# Patient Record
Sex: Male | Born: 1945
Health system: Southern US, Community
[De-identification: ages and names within clinical notes are randomized; demographics above are authoritative.]

## PROBLEM LIST (undated history)

## (undated) DIAGNOSIS — M1990 Unspecified osteoarthritis, unspecified site: Secondary | ICD-10-CM

## (undated) DIAGNOSIS — I4891 Unspecified atrial fibrillation: Secondary | ICD-10-CM

## (undated) DIAGNOSIS — I499 Cardiac arrhythmia, unspecified: Secondary | ICD-10-CM

## (undated) DIAGNOSIS — I10 Essential (primary) hypertension: Secondary | ICD-10-CM

## (undated) DIAGNOSIS — F419 Anxiety disorder, unspecified: Secondary | ICD-10-CM

## (undated) DIAGNOSIS — N400 Enlarged prostate without lower urinary tract symptoms: Secondary | ICD-10-CM

## (undated) HISTORY — PX: HERNIA REPAIR: SHX51

## (undated) HISTORY — PX: CATARACT EXTRACTION: SUR2

## (undated) HISTORY — PX: TONSILLECTOMY: SUR1361

---

## 2002-12-28 ENCOUNTER — Encounter: Payer: Self-pay | Admitting: Internal Medicine

## 2002-12-28 ENCOUNTER — Encounter: Admission: RE | Admit: 2002-12-28 | Discharge: 2002-12-28 | Payer: Self-pay | Admitting: Internal Medicine

## 2003-01-01 ENCOUNTER — Encounter: Admission: RE | Admit: 2003-01-01 | Discharge: 2003-01-01 | Payer: Self-pay | Admitting: Internal Medicine

## 2003-01-01 ENCOUNTER — Encounter: Payer: Self-pay | Admitting: Internal Medicine

## 2004-10-16 ENCOUNTER — Ambulatory Visit (HOSPITAL_COMMUNITY): Admission: RE | Admit: 2004-10-16 | Discharge: 2004-10-16 | Payer: Self-pay | Admitting: Gastroenterology

## 2007-02-23 ENCOUNTER — Encounter: Admission: RE | Admit: 2007-02-23 | Discharge: 2007-02-23 | Payer: Self-pay | Admitting: Gastroenterology

## 2008-08-26 IMAGING — NM NM HEPATO W/GB/PHARM/[PERSON_NAME]
5 series · 10 of 10 positions shown · non-contrast
Comparison: none

CLINICAL DATA: Abdominal pain particularly right upper quadrant. 
 HEPATOBILIARY SCAN WITH GALLBLADDER EJECTION FRACTION:
TECHNIQUE: Sequential abdominal images were obtained following intravenous injection of radiopharmaceutical.  Sequential images were continued following oral ingestion of 8 oz. Half & Half, and the gallbladder ejection fraction was calculated.
 Radiopharmaceutical:  5 mCi Kc-PPm Choletec

[gb hepatobiliary · 1 of 1 slices shown (1 of 5)]
[im 1/1]
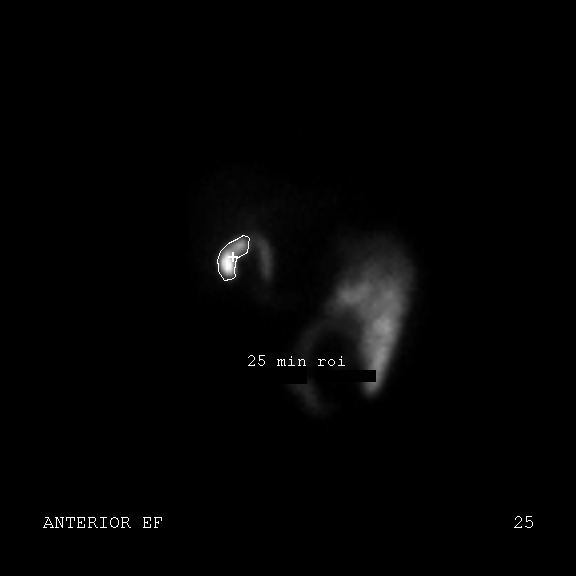

[gb hepatobiliary · 1 of 1 slices shown (2 of 5)]
[im 1/1]
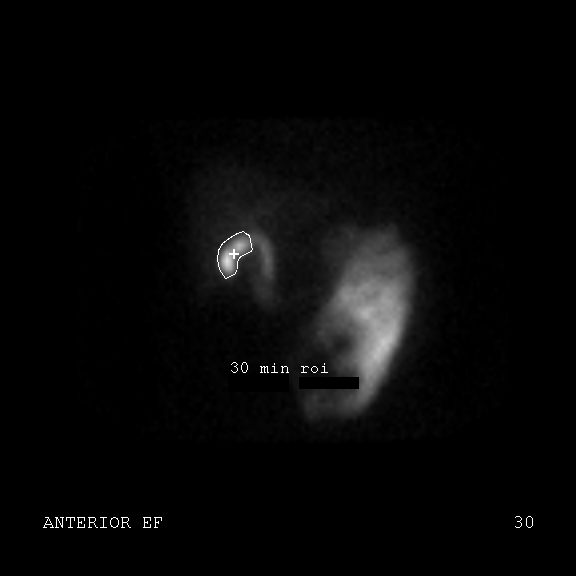

[gb hepatobiliary · 4.66mm/px · 6 of 12 frames shown (3 of 5)]
[frame 2/12]
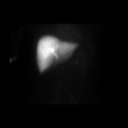
[frame 4/12]
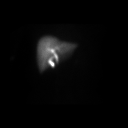
[frame 6/12]
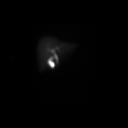
[frame 8/12]
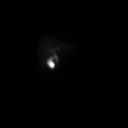
[frame 10/12]
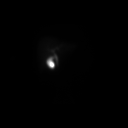
[frame 12/12]
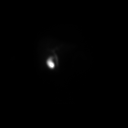

[gb hepatobiliary · 1 of 1 slices shown (4 of 5)]
[im 1/1]
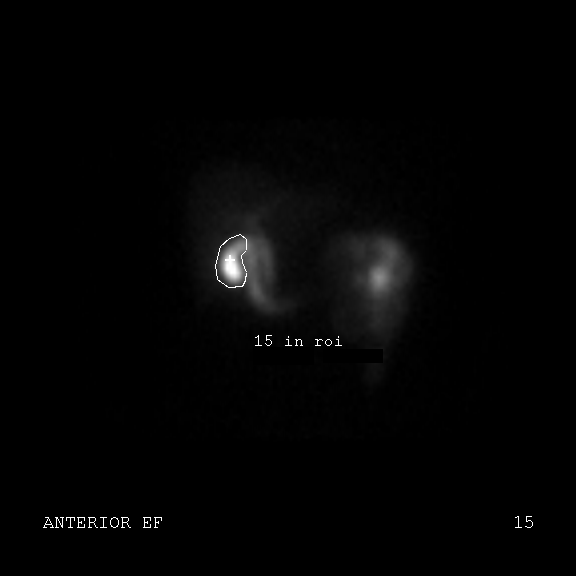

[gb hepatobiliary · 1 of 1 slices shown (5 of 5)]
[im 1/1]
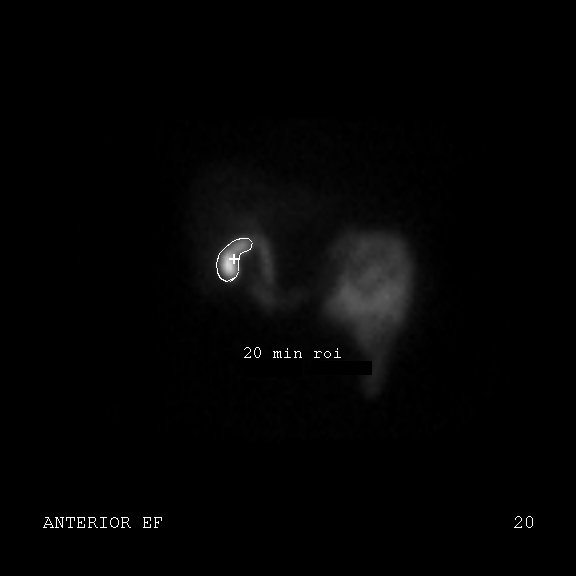

[10 of 10 positions shown; findings below may reference images not displayed]

FINDINGS: The radionuclide appears normally throughout the liver, and there was rapid excretion into the intrahepatic ductal system, common bile duct, and gallbladder with small bowel also visualized.  This represents a normal nuclear medicine hepatobiliary scan.  
 The patient was then given 8 ounces of Half & Half cream orally and imaging over the gallbladder was performed.  At 30 minutes, the gallbladder ejection fraction was measured at 87% which is within normal limits.
IMPRESSION: 1.  Normal nuclear medicine hepatobiliary scan. 
 2.  Normal gallbladder ejection fraction of 87% at 30 minutes.

## 2011-02-13 NOTE — Op Note (Signed)
NAMEJAMESEN, STAHNKE                 ACCOUNT NO.:  [id number]   MEDICAL RECORD NO.:  [id number]          PATIENT TYPE:  AMB   LOCATION:  ENDO                         FACILITY:  [facility]   PHYSICIAN:  Danise Edge, M.D.   DATE OF BIRTH:  05-18-1946   DATE OF PROCEDURE:  10/16/2004  DATE OF DISCHARGE:                                 OPERATIVE REPORT   PROCEDURE:  Screening colonoscopy.   PROCEDURE INDICATION:  Mr. Juan Butler is a 65 year old male born January 11, 1946.  Mr. Juan Butler is scheduled to undergo a screening colonoscopy with  polypectomy to prevent colon cancer.   ENDOSCOPIST:  Danise Edge, M.D.   PREMEDICATION:  Versed 12 mg, Demerol 120 mg.   PROCEDURE:  After obtaining informed consent, Mr. Juan Butler was placed in the  left lateral decubitus position.  I administered intravenous Demerol and  intravenous Versed to achieve conscious sedation for the procedure.  The  patient's blood pressure, oxygen saturation and cardiac rhythm were  monitored throughout the procedure and documented in the medical record.   Anal inspection and digital rectal exam were normal.  The prostate was non-  nodular.  The Olympus adjustable pediatric colonoscope was introduced into  the rectum and advanced to the cecum.  A normal-appearing ileocecal valve  was intubated and the distal ileum inspected.  Colonic preparation for the  exam today was excellent.   Rectum normal.   Sigmoid colon and descending colon normal.   Splenic flexure normal.   Transverse colon normal.   Hepatic flexure normal.   Ascending colon normal.   Cecum and ileocecal valve normal.   Distal ileum normal.   ASSESSMENT:  Normal proctocolonoscopy to the cecum.      MJ/MEDQ  D:  10/16/2004  T:  10/16/2004  Job:  [id number]   cc:   Georgann Housekeeper, MD  [address]  [place]  [place] [address]  Fax: [phone]

## 2015-06-20 ENCOUNTER — Ambulatory Visit (INDEPENDENT_AMBULATORY_CARE_PROVIDER_SITE_OTHER): Payer: Medicare Other

## 2015-06-20 ENCOUNTER — Ambulatory Visit (INDEPENDENT_AMBULATORY_CARE_PROVIDER_SITE_OTHER): Payer: Medicare Other | Admitting: Podiatry

## 2015-06-20 VITALS — BP 132/73 | HR 63 | Resp 16

## 2015-06-20 DIAGNOSIS — M722 Plantar fascial fibromatosis: Secondary | ICD-10-CM

## 2015-06-20 DIAGNOSIS — M79673 Pain in unspecified foot: Secondary | ICD-10-CM

## 2015-06-20 MED ORDER — TRIAMCINOLONE ACETONIDE 10 MG/ML IJ SUSP: 10.0000 mg | Freq: Once | INTRAMUSCULAR | Status: DC

## 2015-06-20 NOTE — Progress Notes (Signed)
   Subjective:    Patient ID: Juan Butler, male    DOB: 1946-08-22, 69 y.o.   MRN: [id number]  HPI Pt presents with pain in right heel on the bottom lasting 2 weeks, has tried otc inserts which were helpful but still having pain   Review of Systems  All other systems reviewed and are negative.      Objective:   Physical Exam        Assessment & Plan:

## 2015-06-20 NOTE — Patient Instructions (Signed)

## 2015-06-21 NOTE — Progress Notes (Signed)
Subjective:     Patient ID: Juan Butler, male   DOB: 1946/08/14, 69 y.o.   MRN: [id number]  HPI patient states my right heel has been bothering me a lot and it's been getting gradually worse recently. I've tried to reduce activity and change shoes   Review of Systems  All other systems reviewed and are negative.      Objective:   Physical Exam  Constitutional: He is oriented to person, place, and time.  Cardiovascular: Intact distal pulses.   Musculoskeletal: Normal range of motion.  Neurological: He is oriented to person, place, and time.  Skin: Skin is warm.  Nursing note and vitals reviewed.  neurovascular status found to be intact with muscle strength adequate range of motion within normal limits. Patient's noted to have good digital perfusion is well oriented 3 and has exquisite discomfort plantar aspect heel at the insertional point of the tendon into the calcaneus     Assessment:     Plantar fasciitis right with inflammation of the medial band    Plan:     H&P and x-rays reviewed and today I injected the plantar fascia 3 mg Kenalog 5 mg Xylocaine and applied fascial brace with instructions on usage. Reappoint to recheck

## 2015-07-04 ENCOUNTER — Ambulatory Visit (INDEPENDENT_AMBULATORY_CARE_PROVIDER_SITE_OTHER): Payer: Medicare Other | Admitting: Podiatry

## 2015-07-04 ENCOUNTER — Encounter: Payer: Self-pay | Admitting: Podiatry

## 2015-07-04 DIAGNOSIS — M722 Plantar fascial fibromatosis: Secondary | ICD-10-CM

## 2015-07-06 NOTE — Progress Notes (Signed)
Subjective:     Patient ID: Juan Butler, male   DOB: Jan 31, 1946, 69 y.o.   MRN: [id number]  HPI patient presents with pain in the right heel which is improving quite a bit with mild discomfort with prolonged ambulation   Review of Systems     Objective:   Physical Exam Neurovascular status found to be intact muscle strength was adequate with discomfort in the right plantar heel that is significantly improved from previous visit    Assessment:     Improved plantar fasciitis right with diminishment of fluid buildup    Plan:     Reviewed condition and advised on physical therapy anti-inflammatory's and supportive shoe gear usage. Patient will be seen back to recheck again as needed

## 2019-10-24 ENCOUNTER — Ambulatory Visit: Payer: Self-pay

## 2019-11-02 ENCOUNTER — Ambulatory Visit: Payer: Medicare Other | Attending: Internal Medicine

## 2019-11-02 DIAGNOSIS — Z23 Encounter for immunization: Secondary | ICD-10-CM

## 2019-11-02 NOTE — Progress Notes (Signed)
   Covid-19 Vaccination Clinic  Name:  Juan Butler    MRN: [id number] DOB: Jul 01, 1946  11/02/2019  Mr. Juan Butler was observed post Covid-19 immunization for 15 minutes without incidence. He was provided with Vaccine Information Sheet and instruction to access the V-Safe system.   Mr. Juan Butler was instructed to call 911 with any severe reactions post vaccine: Marland Kitchen Difficulty breathing  . Swelling of your face and throat  . A fast heartbeat  . A bad rash all over your body  . Dizziness and weakness    Immunizations Administered    Name Date Dose VIS Date Route   Pfizer COVID-19 Vaccine 11/02/2019  1:37 PM 0.3 mL 09/08/2019 Intramuscular   Manufacturer: [place]   Lot: [id number]   [place]: [id number]

## 2019-11-27 ENCOUNTER — Ambulatory Visit: Payer: Medicare Other | Attending: Internal Medicine

## 2019-11-27 DIAGNOSIS — Z23 Encounter for immunization: Secondary | ICD-10-CM | POA: Insufficient documentation

## 2019-11-27 NOTE — Progress Notes (Signed)
   Covid-19 Vaccination Clinic  Name:  Juan Butler    MRN: [id number] DOB: 11/24/1945  11/27/2019  [name] was observed post Covid-19 immunization for 15 minutes without incidence. He was provided with Vaccine Information Sheet and instruction to access the V-Safe system.   [name] was instructed to call 911 with any severe reactions post vaccine: Marland Kitchen Difficulty breathing  . Swelling of your face and throat  . A fast heartbeat  . A bad rash all over your body  . Dizziness and weakness    Immunizations Administered    Name Date Dose VIS Date Route   Pfizer COVID-19 Vaccine 11/27/2019  3:10 PM 0.3 mL 09/08/2019 Intramuscular   Manufacturer: [place]   Lot: [id number]   [place]: [id number]

## 2020-07-15 ENCOUNTER — Other Ambulatory Visit: Payer: Self-pay

## 2020-07-15 ENCOUNTER — Ambulatory Visit (INDEPENDENT_AMBULATORY_CARE_PROVIDER_SITE_OTHER): Payer: Medicare Other | Admitting: Otolaryngology

## 2020-07-15 ENCOUNTER — Encounter (INDEPENDENT_AMBULATORY_CARE_PROVIDER_SITE_OTHER): Payer: Self-pay | Admitting: Otolaryngology

## 2020-07-15 VITALS — Temp 97.7°F

## 2020-07-15 DIAGNOSIS — H6982 Other specified disorders of Eustachian tube, left ear: Secondary | ICD-10-CM

## 2020-07-15 DIAGNOSIS — H6123 Impacted cerumen, bilateral: Secondary | ICD-10-CM | POA: Diagnosis not present

## 2020-07-15 NOTE — Progress Notes (Signed)
HPI: Juan Butler is a 74 y.o. male who presents for evaluation of left ear complaints.  He was having some popping and sensation like he had fluid in the ear.  This was on the left side.  He has history of wax problems and is used Q-tips regularly and wondered if he had wax buildup in his ears.  He was seen by his medical physician PA who thought that he had fluid behind his ears and prescribed Zyrtec and also Flonase.  He tried Zyrtec without much benefit but has not tried the [facility].  He is not noticing any hearing problems.  No past medical history on file.  Social History   Socioeconomic History  . Marital status: Married    Spouse name: Not on file  . Number of children: Not on file  . Years of education: Not on file  . Highest education level: Not on file  Occupational History  . Not on file  Tobacco Use  . Smoking status: Never Smoker  . Smokeless tobacco: Never Used  Substance and Sexual Activity  . Alcohol use: Not on file  . Drug use: Not on file  . Sexual activity: Not on file  Other Topics Concern  . Not on file  Social History Narrative  . Not on file   Social Determinants of Health   Financial Resource Strain:   . Difficulty of Paying Living Expenses: Not on file  Food Insecurity:   . Worried About Charity fundraiser in the Last Year: Not on file  . Ran Out of Food in the Last Year: Not on file  Transportation Needs:   . Lack of Transportation (Medical): Not on file  . Lack of Transportation (Non-Medical): Not on file  Physical Activity:   . Days of Exercise per Week: Not on file  . Minutes of Exercise per Session: Not on file  Stress:   . Feeling of Stress : Not on file  Social Connections:   . Frequency of Communication with Friends and Family: Not on file  . Frequency of Social Gatherings with Friends and Family: Not on file  . Attends Religious Services: Not on file  . Active Member of Clubs or Organizations: Not on file  . Attends Theatre manager Meetings: Not on file  . Marital Status: Not on file   No family history on file. No Known Allergies Prior to Admission medications   Not on File     Positive ROS: Otherwise negative  All other systems have been reviewed and were otherwise negative with the exception of those mentioned in the HPI and as above.  Physical Exam: Constitutional: Alert, well-appearing, no acute distress Ears: External ears without lesions or tenderness.  He had minimal wax buildup in both ears that was cleaned with curette and suction's.  Left ear canal was irrigated with hydrogen peroxide and suction cleaned.  But there is only minimal wax buildup.  The TM itself was clear with good mobility on pneumatic otoscopy and no obvious serous otitis noted on clinical exam in the office today.  On tuning fork testing heard about the same in both ears with AC > BC bilaterally.  Weber was midline. Nasal: External nose without lesions. Septum mild deformity and mild rhinitis..  Middle meatus regions were clear with no signs of infection Oral: Lips and gums without lesions. Tongue and palate mucosa without lesions. Posterior oropharynx clear. Neck: No palpable adenopathy or masses Respiratory: Breathing comfortably  Skin: No facial/neck  lesions or rash noted.  Cerumen impaction removal  Date/Time: 07/15/2020 2:15 PM Performed by: Rozetta Nunnery, MD Authorized by: Rozetta Nunnery, MD   Consent:    Consent obtained:  Verbal   Consent given by:  Patient   Risks discussed:  Pain and bleeding Procedure details:    Location:  L ear and R ear   Procedure type: curette and suction   Post-procedure details:    Inspection:  TM intact and canal normal   Hearing quality:  Improved   Patient tolerance of procedure:  Tolerated well, no immediate complications Comments:     TMs are clear bilaterally.    Assessment: Minimal wax buildup.  Otherwise normal ear and TM  examination.  Plan: Suggested use of the Flonase 2 sprays each nostril at night if he is having symptoms with eustachian tube problems. Recommended not using Q-tips and use of hydroperoxide or Debrox drops if he has wax buildup. He will follow-up as needed.  [name], MD

## 2020-09-05 DIAGNOSIS — L089 Local infection of the skin and subcutaneous tissue, unspecified: Secondary | ICD-10-CM | POA: Insufficient documentation

## 2020-10-02 DIAGNOSIS — M5136 Other intervertebral disc degeneration, lumbar region: Secondary | ICD-10-CM | POA: Diagnosis not present

## 2020-10-02 DIAGNOSIS — M9905 Segmental and somatic dysfunction of pelvic region: Secondary | ICD-10-CM | POA: Diagnosis not present

## 2020-10-02 DIAGNOSIS — M9904 Segmental and somatic dysfunction of sacral region: Secondary | ICD-10-CM | POA: Diagnosis not present

## 2020-10-02 DIAGNOSIS — M9903 Segmental and somatic dysfunction of lumbar region: Secondary | ICD-10-CM | POA: Diagnosis not present

## 2020-10-09 DIAGNOSIS — H25812 Combined forms of age-related cataract, left eye: Secondary | ICD-10-CM | POA: Diagnosis not present

## 2020-10-09 DIAGNOSIS — H2512 Age-related nuclear cataract, left eye: Secondary | ICD-10-CM | POA: Diagnosis not present

## 2020-10-09 DIAGNOSIS — H52222 Regular astigmatism, left eye: Secondary | ICD-10-CM | POA: Diagnosis not present

## 2020-10-15 DIAGNOSIS — H25011 Cortical age-related cataract, right eye: Secondary | ICD-10-CM | POA: Diagnosis not present

## 2020-10-15 DIAGNOSIS — H2511 Age-related nuclear cataract, right eye: Secondary | ICD-10-CM | POA: Diagnosis not present

## 2020-10-16 DIAGNOSIS — L218 Other seborrheic dermatitis: Secondary | ICD-10-CM | POA: Diagnosis not present

## 2020-10-17 DIAGNOSIS — M5136 Other intervertebral disc degeneration, lumbar region: Secondary | ICD-10-CM | POA: Diagnosis not present

## 2020-10-17 DIAGNOSIS — M9904 Segmental and somatic dysfunction of sacral region: Secondary | ICD-10-CM | POA: Diagnosis not present

## 2020-10-17 DIAGNOSIS — M9905 Segmental and somatic dysfunction of pelvic region: Secondary | ICD-10-CM | POA: Diagnosis not present

## 2020-10-17 DIAGNOSIS — M9903 Segmental and somatic dysfunction of lumbar region: Secondary | ICD-10-CM | POA: Diagnosis not present

## 2020-10-23 DIAGNOSIS — M5136 Other intervertebral disc degeneration, lumbar region: Secondary | ICD-10-CM | POA: Diagnosis not present

## 2020-10-23 DIAGNOSIS — M9905 Segmental and somatic dysfunction of pelvic region: Secondary | ICD-10-CM | POA: Diagnosis not present

## 2020-10-23 DIAGNOSIS — M9904 Segmental and somatic dysfunction of sacral region: Secondary | ICD-10-CM | POA: Diagnosis not present

## 2020-10-23 DIAGNOSIS — M9903 Segmental and somatic dysfunction of lumbar region: Secondary | ICD-10-CM | POA: Diagnosis not present

## 2020-10-25 DIAGNOSIS — H353132 Nonexudative age-related macular degeneration, bilateral, intermediate dry stage: Secondary | ICD-10-CM | POA: Diagnosis not present

## 2020-10-30 DIAGNOSIS — H25011 Cortical age-related cataract, right eye: Secondary | ICD-10-CM | POA: Diagnosis not present

## 2020-10-30 DIAGNOSIS — H52201 Unspecified astigmatism, right eye: Secondary | ICD-10-CM | POA: Diagnosis not present

## 2020-10-30 DIAGNOSIS — H2511 Age-related nuclear cataract, right eye: Secondary | ICD-10-CM | POA: Diagnosis not present

## 2020-10-30 DIAGNOSIS — H25811 Combined forms of age-related cataract, right eye: Secondary | ICD-10-CM | POA: Diagnosis not present

## 2020-11-07 DIAGNOSIS — M5136 Other intervertebral disc degeneration, lumbar region: Secondary | ICD-10-CM | POA: Diagnosis not present

## 2020-11-07 DIAGNOSIS — M9903 Segmental and somatic dysfunction of lumbar region: Secondary | ICD-10-CM | POA: Diagnosis not present

## 2020-11-07 DIAGNOSIS — M9905 Segmental and somatic dysfunction of pelvic region: Secondary | ICD-10-CM | POA: Diagnosis not present

## 2020-11-07 DIAGNOSIS — M9904 Segmental and somatic dysfunction of sacral region: Secondary | ICD-10-CM | POA: Diagnosis not present

## 2020-11-21 DIAGNOSIS — M9905 Segmental and somatic dysfunction of pelvic region: Secondary | ICD-10-CM | POA: Diagnosis not present

## 2020-11-21 DIAGNOSIS — M5136 Other intervertebral disc degeneration, lumbar region: Secondary | ICD-10-CM | POA: Diagnosis not present

## 2020-11-21 DIAGNOSIS — M9904 Segmental and somatic dysfunction of sacral region: Secondary | ICD-10-CM | POA: Diagnosis not present

## 2020-11-21 DIAGNOSIS — M9903 Segmental and somatic dysfunction of lumbar region: Secondary | ICD-10-CM | POA: Diagnosis not present

## 2020-11-28 DIAGNOSIS — R3915 Urgency of urination: Secondary | ICD-10-CM | POA: Diagnosis not present

## 2020-12-05 DIAGNOSIS — M5136 Other intervertebral disc degeneration, lumbar region: Secondary | ICD-10-CM | POA: Diagnosis not present

## 2020-12-05 DIAGNOSIS — M9903 Segmental and somatic dysfunction of lumbar region: Secondary | ICD-10-CM | POA: Diagnosis not present

## 2020-12-05 DIAGNOSIS — M9905 Segmental and somatic dysfunction of pelvic region: Secondary | ICD-10-CM | POA: Diagnosis not present

## 2020-12-05 DIAGNOSIS — M9904 Segmental and somatic dysfunction of sacral region: Secondary | ICD-10-CM | POA: Diagnosis not present

## 2020-12-19 DIAGNOSIS — M5136 Other intervertebral disc degeneration, lumbar region: Secondary | ICD-10-CM | POA: Diagnosis not present

## 2020-12-19 DIAGNOSIS — M9903 Segmental and somatic dysfunction of lumbar region: Secondary | ICD-10-CM | POA: Diagnosis not present

## 2020-12-19 DIAGNOSIS — M9904 Segmental and somatic dysfunction of sacral region: Secondary | ICD-10-CM | POA: Diagnosis not present

## 2020-12-19 DIAGNOSIS — M9905 Segmental and somatic dysfunction of pelvic region: Secondary | ICD-10-CM | POA: Diagnosis not present

## 2020-12-24 DIAGNOSIS — H353132 Nonexudative age-related macular degeneration, bilateral, intermediate dry stage: Secondary | ICD-10-CM | POA: Diagnosis not present

## 2021-01-02 DIAGNOSIS — M5136 Other intervertebral disc degeneration, lumbar region: Secondary | ICD-10-CM | POA: Diagnosis not present

## 2021-01-02 DIAGNOSIS — M9905 Segmental and somatic dysfunction of pelvic region: Secondary | ICD-10-CM | POA: Diagnosis not present

## 2021-01-02 DIAGNOSIS — M9904 Segmental and somatic dysfunction of sacral region: Secondary | ICD-10-CM | POA: Diagnosis not present

## 2021-01-02 DIAGNOSIS — M9903 Segmental and somatic dysfunction of lumbar region: Secondary | ICD-10-CM | POA: Diagnosis not present

## 2021-01-06 ENCOUNTER — Other Ambulatory Visit: Payer: Self-pay

## 2021-01-06 ENCOUNTER — Ambulatory Visit: Payer: Medicare Other | Admitting: Podiatry

## 2021-01-06 ENCOUNTER — Encounter: Payer: Self-pay | Admitting: Podiatry

## 2021-01-06 DIAGNOSIS — L600 Ingrowing nail: Secondary | ICD-10-CM | POA: Diagnosis not present

## 2021-01-06 DIAGNOSIS — M79672 Pain in left foot: Secondary | ICD-10-CM

## 2021-01-06 NOTE — Patient Instructions (Signed)

## 2021-01-07 NOTE — Progress Notes (Signed)
Subjective:   Patient ID: Juan Butler, male   DOB: 75 y.o.   MRN: [id number]   HPI Patient states he had his left big toenail removed and is not satisfied it is digging into his skin and he thinks he needs to have it read permanently.  Patient does not smoke likes to be active   Review of Systems  All other systems reviewed and are negative.       Objective:  Physical Exam Vitals and nursing note reviewed.  Constitutional:      Appearance: He is well-developed.  Pulmonary:     Effort: Pulmonary effort is normal.  Musculoskeletal:        General: Normal range of motion.  Skin:    General: Skin is warm.  Neurological:     Mental Status: He is alert.     Neurovascular status intact muscle strength found to be adequate range of motion adequate.  Patient is found to have a deformed left hallux nail that is dystrophic painful when pressed dorsally and is structurally abnormal in its appearance.  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  He is opted for surgical intervention I recommended removal of the nail and I did explain procedure risk and patient wants this done.  At this point I infiltrated 60 mg like Marcaine mixture sterile prep done and using sterile instrumentation remove the hallux nail exposed matrix applied phenol 5 applications 30 seconds followed by alcohol lavage sterile dressing.  Gave instructions on soaks and reappoint and encouraged him to call with questions and will leave dressing on 24 hours but take it off earlier if any throbbing were to occur

## 2021-01-15 DIAGNOSIS — M9905 Segmental and somatic dysfunction of pelvic region: Secondary | ICD-10-CM | POA: Diagnosis not present

## 2021-01-15 DIAGNOSIS — M9904 Segmental and somatic dysfunction of sacral region: Secondary | ICD-10-CM | POA: Diagnosis not present

## 2021-01-15 DIAGNOSIS — M9903 Segmental and somatic dysfunction of lumbar region: Secondary | ICD-10-CM | POA: Diagnosis not present

## 2021-01-15 DIAGNOSIS — M5136 Other intervertebral disc degeneration, lumbar region: Secondary | ICD-10-CM | POA: Diagnosis not present

## 2021-01-29 DIAGNOSIS — M9905 Segmental and somatic dysfunction of pelvic region: Secondary | ICD-10-CM | POA: Diagnosis not present

## 2021-01-29 DIAGNOSIS — M5136 Other intervertebral disc degeneration, lumbar region: Secondary | ICD-10-CM | POA: Diagnosis not present

## 2021-01-29 DIAGNOSIS — M9903 Segmental and somatic dysfunction of lumbar region: Secondary | ICD-10-CM | POA: Diagnosis not present

## 2021-01-29 DIAGNOSIS — M9904 Segmental and somatic dysfunction of sacral region: Secondary | ICD-10-CM | POA: Diagnosis not present

## 2021-02-11 DIAGNOSIS — R7303 Prediabetes: Secondary | ICD-10-CM | POA: Diagnosis not present

## 2021-02-11 DIAGNOSIS — N182 Chronic kidney disease, stage 2 (mild): Secondary | ICD-10-CM | POA: Diagnosis not present

## 2021-02-11 DIAGNOSIS — Z1211 Encounter for screening for malignant neoplasm of colon: Secondary | ICD-10-CM | POA: Diagnosis not present

## 2021-02-11 DIAGNOSIS — I10 Essential (primary) hypertension: Secondary | ICD-10-CM | POA: Diagnosis not present

## 2021-02-12 DIAGNOSIS — M5136 Other intervertebral disc degeneration, lumbar region: Secondary | ICD-10-CM | POA: Diagnosis not present

## 2021-02-12 DIAGNOSIS — M9904 Segmental and somatic dysfunction of sacral region: Secondary | ICD-10-CM | POA: Diagnosis not present

## 2021-02-12 DIAGNOSIS — M9905 Segmental and somatic dysfunction of pelvic region: Secondary | ICD-10-CM | POA: Diagnosis not present

## 2021-02-12 DIAGNOSIS — M9903 Segmental and somatic dysfunction of lumbar region: Secondary | ICD-10-CM | POA: Diagnosis not present

## 2021-02-22 DIAGNOSIS — H353132 Nonexudative age-related macular degeneration, bilateral, intermediate dry stage: Secondary | ICD-10-CM | POA: Diagnosis not present

## 2021-02-26 DIAGNOSIS — M5136 Other intervertebral disc degeneration, lumbar region: Secondary | ICD-10-CM | POA: Diagnosis not present

## 2021-02-26 DIAGNOSIS — M9903 Segmental and somatic dysfunction of lumbar region: Secondary | ICD-10-CM | POA: Diagnosis not present

## 2021-02-26 DIAGNOSIS — M9905 Segmental and somatic dysfunction of pelvic region: Secondary | ICD-10-CM | POA: Diagnosis not present

## 2021-02-26 DIAGNOSIS — M9904 Segmental and somatic dysfunction of sacral region: Secondary | ICD-10-CM | POA: Diagnosis not present

## 2021-03-13 DIAGNOSIS — M5136 Other intervertebral disc degeneration, lumbar region: Secondary | ICD-10-CM | POA: Diagnosis not present

## 2021-03-13 DIAGNOSIS — M9903 Segmental and somatic dysfunction of lumbar region: Secondary | ICD-10-CM | POA: Diagnosis not present

## 2021-03-13 DIAGNOSIS — M9905 Segmental and somatic dysfunction of pelvic region: Secondary | ICD-10-CM | POA: Diagnosis not present

## 2021-03-13 DIAGNOSIS — M9904 Segmental and somatic dysfunction of sacral region: Secondary | ICD-10-CM | POA: Diagnosis not present

## 2021-03-24 DIAGNOSIS — H353132 Nonexudative age-related macular degeneration, bilateral, intermediate dry stage: Secondary | ICD-10-CM | POA: Diagnosis not present

## 2021-03-26 DIAGNOSIS — M9904 Segmental and somatic dysfunction of sacral region: Secondary | ICD-10-CM | POA: Diagnosis not present

## 2021-03-26 DIAGNOSIS — M9903 Segmental and somatic dysfunction of lumbar region: Secondary | ICD-10-CM | POA: Diagnosis not present

## 2021-03-26 DIAGNOSIS — M9905 Segmental and somatic dysfunction of pelvic region: Secondary | ICD-10-CM | POA: Diagnosis not present

## 2021-03-26 DIAGNOSIS — M5136 Other intervertebral disc degeneration, lumbar region: Secondary | ICD-10-CM | POA: Diagnosis not present

## 2021-04-09 DIAGNOSIS — M9905 Segmental and somatic dysfunction of pelvic region: Secondary | ICD-10-CM | POA: Diagnosis not present

## 2021-04-09 DIAGNOSIS — M9904 Segmental and somatic dysfunction of sacral region: Secondary | ICD-10-CM | POA: Diagnosis not present

## 2021-04-09 DIAGNOSIS — M5136 Other intervertebral disc degeneration, lumbar region: Secondary | ICD-10-CM | POA: Diagnosis not present

## 2021-04-09 DIAGNOSIS — M9903 Segmental and somatic dysfunction of lumbar region: Secondary | ICD-10-CM | POA: Diagnosis not present

## 2021-04-23 DIAGNOSIS — M5136 Other intervertebral disc degeneration, lumbar region: Secondary | ICD-10-CM | POA: Diagnosis not present

## 2021-04-23 DIAGNOSIS — H353132 Nonexudative age-related macular degeneration, bilateral, intermediate dry stage: Secondary | ICD-10-CM | POA: Diagnosis not present

## 2021-04-23 DIAGNOSIS — M9903 Segmental and somatic dysfunction of lumbar region: Secondary | ICD-10-CM | POA: Diagnosis not present

## 2021-04-23 DIAGNOSIS — M9904 Segmental and somatic dysfunction of sacral region: Secondary | ICD-10-CM | POA: Diagnosis not present

## 2021-04-23 DIAGNOSIS — M9905 Segmental and somatic dysfunction of pelvic region: Secondary | ICD-10-CM | POA: Diagnosis not present

## 2021-05-06 DIAGNOSIS — H35033 Hypertensive retinopathy, bilateral: Secondary | ICD-10-CM | POA: Diagnosis not present

## 2021-05-06 DIAGNOSIS — H353133 Nonexudative age-related macular degeneration, bilateral, advanced atrophic without subfoveal involvement: Secondary | ICD-10-CM | POA: Diagnosis not present

## 2021-05-06 DIAGNOSIS — H43812 Vitreous degeneration, left eye: Secondary | ICD-10-CM | POA: Diagnosis not present

## 2021-05-06 DIAGNOSIS — H40013 Open angle with borderline findings, low risk, bilateral: Secondary | ICD-10-CM | POA: Diagnosis not present

## 2021-05-07 DIAGNOSIS — M9905 Segmental and somatic dysfunction of pelvic region: Secondary | ICD-10-CM | POA: Diagnosis not present

## 2021-05-07 DIAGNOSIS — M9904 Segmental and somatic dysfunction of sacral region: Secondary | ICD-10-CM | POA: Diagnosis not present

## 2021-05-07 DIAGNOSIS — M9903 Segmental and somatic dysfunction of lumbar region: Secondary | ICD-10-CM | POA: Diagnosis not present

## 2021-05-07 DIAGNOSIS — M5136 Other intervertebral disc degeneration, lumbar region: Secondary | ICD-10-CM | POA: Diagnosis not present

## 2021-05-16 DIAGNOSIS — Z1212 Encounter for screening for malignant neoplasm of rectum: Secondary | ICD-10-CM | POA: Diagnosis not present

## 2021-05-16 DIAGNOSIS — Z1211 Encounter for screening for malignant neoplasm of colon: Secondary | ICD-10-CM | POA: Diagnosis not present

## 2021-05-21 DIAGNOSIS — M9905 Segmental and somatic dysfunction of pelvic region: Secondary | ICD-10-CM | POA: Diagnosis not present

## 2021-05-21 DIAGNOSIS — M9904 Segmental and somatic dysfunction of sacral region: Secondary | ICD-10-CM | POA: Diagnosis not present

## 2021-05-21 DIAGNOSIS — M9903 Segmental and somatic dysfunction of lumbar region: Secondary | ICD-10-CM | POA: Diagnosis not present

## 2021-05-21 DIAGNOSIS — M5136 Other intervertebral disc degeneration, lumbar region: Secondary | ICD-10-CM | POA: Diagnosis not present

## 2021-05-23 DIAGNOSIS — H353132 Nonexudative age-related macular degeneration, bilateral, intermediate dry stage: Secondary | ICD-10-CM | POA: Diagnosis not present

## 2021-05-24 LAB — COLOGUARD: COLOGUARD: NEGATIVE

## 2021-05-24 LAB — EXTERNAL GENERIC LAB PROCEDURE: COLOGUARD: NEGATIVE

## 2021-06-03 DIAGNOSIS — M25562 Pain in left knee: Secondary | ICD-10-CM | POA: Diagnosis not present

## 2021-06-03 DIAGNOSIS — S76312A Strain of muscle, fascia and tendon of the posterior muscle group at thigh level, left thigh, initial encounter: Secondary | ICD-10-CM | POA: Diagnosis not present

## 2021-06-04 DIAGNOSIS — M9905 Segmental and somatic dysfunction of pelvic region: Secondary | ICD-10-CM | POA: Diagnosis not present

## 2021-06-04 DIAGNOSIS — M9904 Segmental and somatic dysfunction of sacral region: Secondary | ICD-10-CM | POA: Diagnosis not present

## 2021-06-04 DIAGNOSIS — M5136 Other intervertebral disc degeneration, lumbar region: Secondary | ICD-10-CM | POA: Diagnosis not present

## 2021-06-04 DIAGNOSIS — M9903 Segmental and somatic dysfunction of lumbar region: Secondary | ICD-10-CM | POA: Diagnosis not present

## 2021-06-18 DIAGNOSIS — M9904 Segmental and somatic dysfunction of sacral region: Secondary | ICD-10-CM | POA: Diagnosis not present

## 2021-06-18 DIAGNOSIS — M9903 Segmental and somatic dysfunction of lumbar region: Secondary | ICD-10-CM | POA: Diagnosis not present

## 2021-06-18 DIAGNOSIS — M5136 Other intervertebral disc degeneration, lumbar region: Secondary | ICD-10-CM | POA: Diagnosis not present

## 2021-06-18 DIAGNOSIS — M9905 Segmental and somatic dysfunction of pelvic region: Secondary | ICD-10-CM | POA: Diagnosis not present

## 2021-07-04 DIAGNOSIS — S0501XA Injury of conjunctiva and corneal abrasion without foreign body, right eye, initial encounter: Secondary | ICD-10-CM | POA: Diagnosis not present

## 2021-07-07 ENCOUNTER — Encounter: Payer: Self-pay | Admitting: Podiatry

## 2021-07-07 ENCOUNTER — Other Ambulatory Visit: Payer: Self-pay

## 2021-07-07 ENCOUNTER — Ambulatory Visit: Payer: Medicare Other | Admitting: Podiatry

## 2021-07-07 DIAGNOSIS — L600 Ingrowing nail: Secondary | ICD-10-CM | POA: Diagnosis not present

## 2021-07-07 NOTE — Progress Notes (Signed)
Subjective:   Patient ID: Juan Butler, male   DOB: 75 y.o.   MRN: [id number]   HPI Patient presents stating the left big toenail that was removed he thinks it may be regrowth or irritation with some redness and wants it checked neuro   ROS      Objective:  Physical Exam  Vascular status intact with patient's left hallux nail bed healing well slight crusted tissue in the corners but no indications of significant nail growth     Assessment:  Appears to be more of an irritation of tissue versus nail     Plan:  H&P reviewed ingrown toenail and condition and I do not recommend exploration but I did discuss soaks and applied padding to the toe with wider shoe gear and if symptoms persist will need to consider something else

## 2021-07-08 DIAGNOSIS — S0501XA Injury of conjunctiva and corneal abrasion without foreign body, right eye, initial encounter: Secondary | ICD-10-CM | POA: Diagnosis not present

## 2021-07-09 DIAGNOSIS — M9903 Segmental and somatic dysfunction of lumbar region: Secondary | ICD-10-CM | POA: Diagnosis not present

## 2021-07-09 DIAGNOSIS — M9905 Segmental and somatic dysfunction of pelvic region: Secondary | ICD-10-CM | POA: Diagnosis not present

## 2021-07-09 DIAGNOSIS — M9904 Segmental and somatic dysfunction of sacral region: Secondary | ICD-10-CM | POA: Diagnosis not present

## 2021-07-09 DIAGNOSIS — M5136 Other intervertebral disc degeneration, lumbar region: Secondary | ICD-10-CM | POA: Diagnosis not present

## 2021-07-17 DIAGNOSIS — M1712 Unilateral primary osteoarthritis, left knee: Secondary | ICD-10-CM | POA: Diagnosis not present

## 2021-07-17 DIAGNOSIS — S76312A Strain of muscle, fascia and tendon of the posterior muscle group at thigh level, left thigh, initial encounter: Secondary | ICD-10-CM | POA: Diagnosis not present

## 2021-07-17 DIAGNOSIS — M7122 Synovial cyst of popliteal space [Baker], left knee: Secondary | ICD-10-CM | POA: Insufficient documentation

## 2021-07-23 DIAGNOSIS — M5136 Other intervertebral disc degeneration, lumbar region: Secondary | ICD-10-CM | POA: Diagnosis not present

## 2021-07-23 DIAGNOSIS — M9903 Segmental and somatic dysfunction of lumbar region: Secondary | ICD-10-CM | POA: Diagnosis not present

## 2021-07-23 DIAGNOSIS — M9905 Segmental and somatic dysfunction of pelvic region: Secondary | ICD-10-CM | POA: Diagnosis not present

## 2021-07-23 DIAGNOSIS — M9904 Segmental and somatic dysfunction of sacral region: Secondary | ICD-10-CM | POA: Diagnosis not present

## 2021-08-05 DIAGNOSIS — L218 Other seborrheic dermatitis: Secondary | ICD-10-CM | POA: Diagnosis not present

## 2021-08-05 DIAGNOSIS — L821 Other seborrheic keratosis: Secondary | ICD-10-CM | POA: Diagnosis not present

## 2021-08-13 DIAGNOSIS — M5136 Other intervertebral disc degeneration, lumbar region: Secondary | ICD-10-CM | POA: Diagnosis not present

## 2021-08-13 DIAGNOSIS — M9905 Segmental and somatic dysfunction of pelvic region: Secondary | ICD-10-CM | POA: Diagnosis not present

## 2021-08-13 DIAGNOSIS — M9904 Segmental and somatic dysfunction of sacral region: Secondary | ICD-10-CM | POA: Diagnosis not present

## 2021-08-13 DIAGNOSIS — M9903 Segmental and somatic dysfunction of lumbar region: Secondary | ICD-10-CM | POA: Diagnosis not present

## 2021-08-15 DIAGNOSIS — I10 Essential (primary) hypertension: Secondary | ICD-10-CM | POA: Diagnosis not present

## 2021-08-15 DIAGNOSIS — Z13220 Encounter for screening for lipoid disorders: Secondary | ICD-10-CM | POA: Diagnosis not present

## 2021-08-15 DIAGNOSIS — Z0000 Encounter for general adult medical examination without abnormal findings: Secondary | ICD-10-CM | POA: Diagnosis not present

## 2021-08-15 DIAGNOSIS — Z1389 Encounter for screening for other disorder: Secondary | ICD-10-CM | POA: Diagnosis not present

## 2021-08-15 DIAGNOSIS — L409 Psoriasis, unspecified: Secondary | ICD-10-CM | POA: Diagnosis not present

## 2021-08-15 DIAGNOSIS — R7309 Other abnormal glucose: Secondary | ICD-10-CM | POA: Diagnosis not present

## 2021-08-15 DIAGNOSIS — Z136 Encounter for screening for cardiovascular disorders: Secondary | ICD-10-CM | POA: Diagnosis not present

## 2021-08-15 DIAGNOSIS — K219 Gastro-esophageal reflux disease without esophagitis: Secondary | ICD-10-CM | POA: Diagnosis not present

## 2021-08-15 DIAGNOSIS — N182 Chronic kidney disease, stage 2 (mild): Secondary | ICD-10-CM | POA: Diagnosis not present

## 2021-08-27 DIAGNOSIS — M9904 Segmental and somatic dysfunction of sacral region: Secondary | ICD-10-CM | POA: Diagnosis not present

## 2021-08-27 DIAGNOSIS — M5136 Other intervertebral disc degeneration, lumbar region: Secondary | ICD-10-CM | POA: Diagnosis not present

## 2021-08-27 DIAGNOSIS — M9903 Segmental and somatic dysfunction of lumbar region: Secondary | ICD-10-CM | POA: Diagnosis not present

## 2021-08-27 DIAGNOSIS — M9905 Segmental and somatic dysfunction of pelvic region: Secondary | ICD-10-CM | POA: Diagnosis not present

## 2021-09-10 DIAGNOSIS — M5136 Other intervertebral disc degeneration, lumbar region: Secondary | ICD-10-CM | POA: Diagnosis not present

## 2021-09-10 DIAGNOSIS — M9903 Segmental and somatic dysfunction of lumbar region: Secondary | ICD-10-CM | POA: Diagnosis not present

## 2021-09-10 DIAGNOSIS — M9904 Segmental and somatic dysfunction of sacral region: Secondary | ICD-10-CM | POA: Diagnosis not present

## 2021-09-10 DIAGNOSIS — M9905 Segmental and somatic dysfunction of pelvic region: Secondary | ICD-10-CM | POA: Diagnosis not present

## 2021-09-24 DIAGNOSIS — M9904 Segmental and somatic dysfunction of sacral region: Secondary | ICD-10-CM | POA: Diagnosis not present

## 2021-09-24 DIAGNOSIS — M9905 Segmental and somatic dysfunction of pelvic region: Secondary | ICD-10-CM | POA: Diagnosis not present

## 2021-09-24 DIAGNOSIS — M5136 Other intervertebral disc degeneration, lumbar region: Secondary | ICD-10-CM | POA: Diagnosis not present

## 2021-09-24 DIAGNOSIS — M9903 Segmental and somatic dysfunction of lumbar region: Secondary | ICD-10-CM | POA: Diagnosis not present

## 2021-09-25 DIAGNOSIS — S60131A Contusion of right middle finger with damage to nail, initial encounter: Secondary | ICD-10-CM | POA: Diagnosis not present

## 2021-09-25 DIAGNOSIS — S6990XA Unspecified injury of unspecified wrist, hand and finger(s), initial encounter: Secondary | ICD-10-CM | POA: Insufficient documentation

## 2021-10-08 DIAGNOSIS — M5136 Other intervertebral disc degeneration, lumbar region: Secondary | ICD-10-CM | POA: Diagnosis not present

## 2021-10-08 DIAGNOSIS — M9903 Segmental and somatic dysfunction of lumbar region: Secondary | ICD-10-CM | POA: Diagnosis not present

## 2021-10-08 DIAGNOSIS — M9904 Segmental and somatic dysfunction of sacral region: Secondary | ICD-10-CM | POA: Diagnosis not present

## 2021-10-08 DIAGNOSIS — M9905 Segmental and somatic dysfunction of pelvic region: Secondary | ICD-10-CM | POA: Diagnosis not present

## 2021-10-22 DIAGNOSIS — M9903 Segmental and somatic dysfunction of lumbar region: Secondary | ICD-10-CM | POA: Diagnosis not present

## 2021-10-22 DIAGNOSIS — M5136 Other intervertebral disc degeneration, lumbar region: Secondary | ICD-10-CM | POA: Diagnosis not present

## 2021-10-22 DIAGNOSIS — M9905 Segmental and somatic dysfunction of pelvic region: Secondary | ICD-10-CM | POA: Diagnosis not present

## 2021-10-22 DIAGNOSIS — M9904 Segmental and somatic dysfunction of sacral region: Secondary | ICD-10-CM | POA: Diagnosis not present

## 2021-11-05 DIAGNOSIS — M9904 Segmental and somatic dysfunction of sacral region: Secondary | ICD-10-CM | POA: Diagnosis not present

## 2021-11-05 DIAGNOSIS — M9905 Segmental and somatic dysfunction of pelvic region: Secondary | ICD-10-CM | POA: Diagnosis not present

## 2021-11-05 DIAGNOSIS — M5136 Other intervertebral disc degeneration, lumbar region: Secondary | ICD-10-CM | POA: Diagnosis not present

## 2021-11-05 DIAGNOSIS — M9903 Segmental and somatic dysfunction of lumbar region: Secondary | ICD-10-CM | POA: Diagnosis not present

## 2021-11-11 DIAGNOSIS — H40013 Open angle with borderline findings, low risk, bilateral: Secondary | ICD-10-CM | POA: Diagnosis not present

## 2021-11-11 DIAGNOSIS — H353133 Nonexudative age-related macular degeneration, bilateral, advanced atrophic without subfoveal involvement: Secondary | ICD-10-CM | POA: Diagnosis not present

## 2021-11-11 DIAGNOSIS — H04123 Dry eye syndrome of bilateral lacrimal glands: Secondary | ICD-10-CM | POA: Diagnosis not present

## 2021-11-11 DIAGNOSIS — H43812 Vitreous degeneration, left eye: Secondary | ICD-10-CM | POA: Diagnosis not present

## 2021-11-11 DIAGNOSIS — H524 Presbyopia: Secondary | ICD-10-CM | POA: Diagnosis not present

## 2021-11-18 DIAGNOSIS — M13862 Other specified arthritis, left knee: Secondary | ICD-10-CM | POA: Diagnosis not present

## 2021-11-19 DIAGNOSIS — M9903 Segmental and somatic dysfunction of lumbar region: Secondary | ICD-10-CM | POA: Diagnosis not present

## 2021-11-19 DIAGNOSIS — M5136 Other intervertebral disc degeneration, lumbar region: Secondary | ICD-10-CM | POA: Diagnosis not present

## 2021-11-19 DIAGNOSIS — M9905 Segmental and somatic dysfunction of pelvic region: Secondary | ICD-10-CM | POA: Diagnosis not present

## 2021-11-19 DIAGNOSIS — M9904 Segmental and somatic dysfunction of sacral region: Secondary | ICD-10-CM | POA: Diagnosis not present

## 2021-11-28 DIAGNOSIS — R3912 Poor urinary stream: Secondary | ICD-10-CM | POA: Diagnosis not present

## 2021-12-03 DIAGNOSIS — M9904 Segmental and somatic dysfunction of sacral region: Secondary | ICD-10-CM | POA: Diagnosis not present

## 2021-12-03 DIAGNOSIS — M9903 Segmental and somatic dysfunction of lumbar region: Secondary | ICD-10-CM | POA: Diagnosis not present

## 2021-12-03 DIAGNOSIS — M9905 Segmental and somatic dysfunction of pelvic region: Secondary | ICD-10-CM | POA: Diagnosis not present

## 2021-12-03 DIAGNOSIS — M5136 Other intervertebral disc degeneration, lumbar region: Secondary | ICD-10-CM | POA: Diagnosis not present

## 2021-12-24 DIAGNOSIS — M5136 Other intervertebral disc degeneration, lumbar region: Secondary | ICD-10-CM | POA: Diagnosis not present

## 2021-12-24 DIAGNOSIS — M9904 Segmental and somatic dysfunction of sacral region: Secondary | ICD-10-CM | POA: Diagnosis not present

## 2021-12-24 DIAGNOSIS — M9903 Segmental and somatic dysfunction of lumbar region: Secondary | ICD-10-CM | POA: Diagnosis not present

## 2021-12-24 DIAGNOSIS — M9905 Segmental and somatic dysfunction of pelvic region: Secondary | ICD-10-CM | POA: Diagnosis not present

## 2022-01-07 DIAGNOSIS — M5136 Other intervertebral disc degeneration, lumbar region: Secondary | ICD-10-CM | POA: Diagnosis not present

## 2022-01-07 DIAGNOSIS — M9904 Segmental and somatic dysfunction of sacral region: Secondary | ICD-10-CM | POA: Diagnosis not present

## 2022-01-07 DIAGNOSIS — M9903 Segmental and somatic dysfunction of lumbar region: Secondary | ICD-10-CM | POA: Diagnosis not present

## 2022-01-07 DIAGNOSIS — M9905 Segmental and somatic dysfunction of pelvic region: Secondary | ICD-10-CM | POA: Diagnosis not present

## 2022-01-14 DIAGNOSIS — M9903 Segmental and somatic dysfunction of lumbar region: Secondary | ICD-10-CM | POA: Diagnosis not present

## 2022-01-14 DIAGNOSIS — M5136 Other intervertebral disc degeneration, lumbar region: Secondary | ICD-10-CM | POA: Diagnosis not present

## 2022-01-14 DIAGNOSIS — M9905 Segmental and somatic dysfunction of pelvic region: Secondary | ICD-10-CM | POA: Diagnosis not present

## 2022-01-14 DIAGNOSIS — M9904 Segmental and somatic dysfunction of sacral region: Secondary | ICD-10-CM | POA: Diagnosis not present

## 2022-01-21 DIAGNOSIS — M9904 Segmental and somatic dysfunction of sacral region: Secondary | ICD-10-CM | POA: Diagnosis not present

## 2022-01-21 DIAGNOSIS — M9903 Segmental and somatic dysfunction of lumbar region: Secondary | ICD-10-CM | POA: Diagnosis not present

## 2022-01-21 DIAGNOSIS — M9905 Segmental and somatic dysfunction of pelvic region: Secondary | ICD-10-CM | POA: Diagnosis not present

## 2022-01-21 DIAGNOSIS — M5136 Other intervertebral disc degeneration, lumbar region: Secondary | ICD-10-CM | POA: Diagnosis not present

## 2022-01-27 DIAGNOSIS — M5136 Other intervertebral disc degeneration, lumbar region: Secondary | ICD-10-CM | POA: Diagnosis not present

## 2022-01-27 DIAGNOSIS — M9904 Segmental and somatic dysfunction of sacral region: Secondary | ICD-10-CM | POA: Diagnosis not present

## 2022-01-27 DIAGNOSIS — M9905 Segmental and somatic dysfunction of pelvic region: Secondary | ICD-10-CM | POA: Diagnosis not present

## 2022-01-27 DIAGNOSIS — M9903 Segmental and somatic dysfunction of lumbar region: Secondary | ICD-10-CM | POA: Diagnosis not present

## 2022-01-28 DIAGNOSIS — M9905 Segmental and somatic dysfunction of pelvic region: Secondary | ICD-10-CM | POA: Diagnosis not present

## 2022-01-28 DIAGNOSIS — M9904 Segmental and somatic dysfunction of sacral region: Secondary | ICD-10-CM | POA: Diagnosis not present

## 2022-01-28 DIAGNOSIS — M9903 Segmental and somatic dysfunction of lumbar region: Secondary | ICD-10-CM | POA: Diagnosis not present

## 2022-01-28 DIAGNOSIS — M5136 Other intervertebral disc degeneration, lumbar region: Secondary | ICD-10-CM | POA: Diagnosis not present

## 2022-02-04 DIAGNOSIS — M9903 Segmental and somatic dysfunction of lumbar region: Secondary | ICD-10-CM | POA: Diagnosis not present

## 2022-02-04 DIAGNOSIS — M9905 Segmental and somatic dysfunction of pelvic region: Secondary | ICD-10-CM | POA: Diagnosis not present

## 2022-02-04 DIAGNOSIS — M9904 Segmental and somatic dysfunction of sacral region: Secondary | ICD-10-CM | POA: Diagnosis not present

## 2022-02-04 DIAGNOSIS — M5136 Other intervertebral disc degeneration, lumbar region: Secondary | ICD-10-CM | POA: Diagnosis not present

## 2022-02-17 DIAGNOSIS — M9903 Segmental and somatic dysfunction of lumbar region: Secondary | ICD-10-CM | POA: Diagnosis not present

## 2022-02-17 DIAGNOSIS — M5136 Other intervertebral disc degeneration, lumbar region: Secondary | ICD-10-CM | POA: Diagnosis not present

## 2022-02-17 DIAGNOSIS — M9904 Segmental and somatic dysfunction of sacral region: Secondary | ICD-10-CM | POA: Diagnosis not present

## 2022-02-17 DIAGNOSIS — M9905 Segmental and somatic dysfunction of pelvic region: Secondary | ICD-10-CM | POA: Diagnosis not present

## 2022-03-02 DIAGNOSIS — I10 Essential (primary) hypertension: Secondary | ICD-10-CM | POA: Diagnosis not present

## 2022-03-04 DIAGNOSIS — M9903 Segmental and somatic dysfunction of lumbar region: Secondary | ICD-10-CM | POA: Diagnosis not present

## 2022-03-04 DIAGNOSIS — M9905 Segmental and somatic dysfunction of pelvic region: Secondary | ICD-10-CM | POA: Diagnosis not present

## 2022-03-04 DIAGNOSIS — M9904 Segmental and somatic dysfunction of sacral region: Secondary | ICD-10-CM | POA: Diagnosis not present

## 2022-03-04 DIAGNOSIS — M5136 Other intervertebral disc degeneration, lumbar region: Secondary | ICD-10-CM | POA: Diagnosis not present

## 2022-03-11 DIAGNOSIS — M9904 Segmental and somatic dysfunction of sacral region: Secondary | ICD-10-CM | POA: Diagnosis not present

## 2022-03-11 DIAGNOSIS — M9903 Segmental and somatic dysfunction of lumbar region: Secondary | ICD-10-CM | POA: Diagnosis not present

## 2022-03-11 DIAGNOSIS — M5136 Other intervertebral disc degeneration, lumbar region: Secondary | ICD-10-CM | POA: Diagnosis not present

## 2022-03-11 DIAGNOSIS — M9905 Segmental and somatic dysfunction of pelvic region: Secondary | ICD-10-CM | POA: Diagnosis not present

## 2022-03-13 ENCOUNTER — Ambulatory Visit: Payer: Medicare Other | Admitting: Podiatry

## 2022-03-13 ENCOUNTER — Encounter: Payer: Self-pay | Admitting: Podiatry

## 2022-03-13 DIAGNOSIS — L600 Ingrowing nail: Secondary | ICD-10-CM

## 2022-03-13 NOTE — Progress Notes (Signed)
Subjective:   Patient ID: Juan Butler, male   DOB: 76 y.o.   MRN: [id number]   HPI Patient presents these left second and third nailbeds are getting thickened and they are hard for me to cut and I am concerned about ingrown nails and whether they are going to have to be removed like the big toenail   ROS      Objective:  Physical Exam  Neurovascular status intact with thickened second and third nails left foot that are dystrophic     Assessment:  Chronic ingrown component with mycotic component left second and third nails     Plan:  Reviewed different treatment options including removal of the nails educating him on nail removal and at this point I debrided the nailbeds to take pressure off them and he does want to get them fixed at 1 point in future we will look at his schedule and decide when it is best

## 2022-03-18 DIAGNOSIS — M5136 Other intervertebral disc degeneration, lumbar region: Secondary | ICD-10-CM | POA: Diagnosis not present

## 2022-03-18 DIAGNOSIS — M9905 Segmental and somatic dysfunction of pelvic region: Secondary | ICD-10-CM | POA: Diagnosis not present

## 2022-03-18 DIAGNOSIS — M9903 Segmental and somatic dysfunction of lumbar region: Secondary | ICD-10-CM | POA: Diagnosis not present

## 2022-03-18 DIAGNOSIS — M9904 Segmental and somatic dysfunction of sacral region: Secondary | ICD-10-CM | POA: Diagnosis not present

## 2022-03-25 DIAGNOSIS — M9903 Segmental and somatic dysfunction of lumbar region: Secondary | ICD-10-CM | POA: Diagnosis not present

## 2022-03-25 DIAGNOSIS — M9905 Segmental and somatic dysfunction of pelvic region: Secondary | ICD-10-CM | POA: Diagnosis not present

## 2022-03-25 DIAGNOSIS — M9904 Segmental and somatic dysfunction of sacral region: Secondary | ICD-10-CM | POA: Diagnosis not present

## 2022-03-25 DIAGNOSIS — M5136 Other intervertebral disc degeneration, lumbar region: Secondary | ICD-10-CM | POA: Diagnosis not present

## 2022-04-08 DIAGNOSIS — M5136 Other intervertebral disc degeneration, lumbar region: Secondary | ICD-10-CM | POA: Diagnosis not present

## 2022-04-08 DIAGNOSIS — M9905 Segmental and somatic dysfunction of pelvic region: Secondary | ICD-10-CM | POA: Diagnosis not present

## 2022-04-08 DIAGNOSIS — M9903 Segmental and somatic dysfunction of lumbar region: Secondary | ICD-10-CM | POA: Diagnosis not present

## 2022-04-08 DIAGNOSIS — M9904 Segmental and somatic dysfunction of sacral region: Secondary | ICD-10-CM | POA: Diagnosis not present

## 2022-04-22 DIAGNOSIS — M9904 Segmental and somatic dysfunction of sacral region: Secondary | ICD-10-CM | POA: Diagnosis not present

## 2022-04-22 DIAGNOSIS — M5136 Other intervertebral disc degeneration, lumbar region: Secondary | ICD-10-CM | POA: Diagnosis not present

## 2022-04-22 DIAGNOSIS — M9905 Segmental and somatic dysfunction of pelvic region: Secondary | ICD-10-CM | POA: Diagnosis not present

## 2022-04-22 DIAGNOSIS — M9903 Segmental and somatic dysfunction of lumbar region: Secondary | ICD-10-CM | POA: Diagnosis not present

## 2022-04-23 DIAGNOSIS — D1801 Hemangioma of skin and subcutaneous tissue: Secondary | ICD-10-CM | POA: Diagnosis not present

## 2022-04-23 DIAGNOSIS — L821 Other seborrheic keratosis: Secondary | ICD-10-CM | POA: Diagnosis not present

## 2022-04-23 DIAGNOSIS — L812 Freckles: Secondary | ICD-10-CM | POA: Diagnosis not present

## 2022-04-23 DIAGNOSIS — L218 Other seborrheic dermatitis: Secondary | ICD-10-CM | POA: Diagnosis not present

## 2022-05-06 DIAGNOSIS — M9904 Segmental and somatic dysfunction of sacral region: Secondary | ICD-10-CM | POA: Diagnosis not present

## 2022-05-06 DIAGNOSIS — M9905 Segmental and somatic dysfunction of pelvic region: Secondary | ICD-10-CM | POA: Diagnosis not present

## 2022-05-06 DIAGNOSIS — M5136 Other intervertebral disc degeneration, lumbar region: Secondary | ICD-10-CM | POA: Diagnosis not present

## 2022-05-06 DIAGNOSIS — M9903 Segmental and somatic dysfunction of lumbar region: Secondary | ICD-10-CM | POA: Diagnosis not present

## 2022-05-20 DIAGNOSIS — M9904 Segmental and somatic dysfunction of sacral region: Secondary | ICD-10-CM | POA: Diagnosis not present

## 2022-05-20 DIAGNOSIS — M9903 Segmental and somatic dysfunction of lumbar region: Secondary | ICD-10-CM | POA: Diagnosis not present

## 2022-05-20 DIAGNOSIS — M9905 Segmental and somatic dysfunction of pelvic region: Secondary | ICD-10-CM | POA: Diagnosis not present

## 2022-05-20 DIAGNOSIS — M5136 Other intervertebral disc degeneration, lumbar region: Secondary | ICD-10-CM | POA: Diagnosis not present

## 2022-06-03 ENCOUNTER — Other Ambulatory Visit: Payer: Self-pay

## 2022-06-03 ENCOUNTER — Observation Stay (HOSPITAL_COMMUNITY)
Admission: EM | Admit: 2022-06-03 | Discharge: 2022-06-04 | Disposition: A | Discharge status: Home / Self Care | Payer: Medicare Other | Attending: Internal Medicine | Admitting: Internal Medicine

## 2022-06-03 ENCOUNTER — Emergency Department (HOSPITAL_COMMUNITY): Payer: Medicare Other

## 2022-06-03 ENCOUNTER — Encounter (HOSPITAL_COMMUNITY): Payer: Self-pay | Admitting: Emergency Medicine

## 2022-06-03 DIAGNOSIS — Z79899 Other long term (current) drug therapy: Secondary | ICD-10-CM | POA: Insufficient documentation

## 2022-06-03 DIAGNOSIS — M9903 Segmental and somatic dysfunction of lumbar region: Secondary | ICD-10-CM | POA: Diagnosis not present

## 2022-06-03 DIAGNOSIS — M722 Plantar fascial fibromatosis: Secondary | ICD-10-CM

## 2022-06-03 DIAGNOSIS — I10 Essential (primary) hypertension: Secondary | ICD-10-CM | POA: Diagnosis not present

## 2022-06-03 DIAGNOSIS — M9904 Segmental and somatic dysfunction of sacral region: Secondary | ICD-10-CM | POA: Diagnosis not present

## 2022-06-03 DIAGNOSIS — M5136 Other intervertebral disc degeneration, lumbar region: Secondary | ICD-10-CM | POA: Diagnosis not present

## 2022-06-03 DIAGNOSIS — I4891 Unspecified atrial fibrillation: Secondary | ICD-10-CM | POA: Diagnosis not present

## 2022-06-03 DIAGNOSIS — I480 Paroxysmal atrial fibrillation: Secondary | ICD-10-CM | POA: Diagnosis not present

## 2022-06-03 DIAGNOSIS — R918 Other nonspecific abnormal finding of lung field: Secondary | ICD-10-CM | POA: Diagnosis not present

## 2022-06-03 DIAGNOSIS — F419 Anxiety disorder, unspecified: Secondary | ICD-10-CM

## 2022-06-03 DIAGNOSIS — M9905 Segmental and somatic dysfunction of pelvic region: Secondary | ICD-10-CM | POA: Diagnosis not present

## 2022-06-03 DIAGNOSIS — R002 Palpitations: Secondary | ICD-10-CM | POA: Diagnosis not present

## 2022-06-03 HISTORY — DX: Anxiety disorder, unspecified: F41.9

## 2022-06-03 LAB — COMPREHENSIVE METABOLIC PANEL
ALT: 23 U/L (ref 0–44)
AST: 33 U/L (ref 15–41)
Albumin: 4.4 g/dL (ref 3.5–5.0)
Alkaline Phosphatase: 52 U/L (ref 38–126)
Anion gap: 16 — ABNORMAL HIGH (ref 5–15)
BUN: 18 mg/dL (ref 8–23)
CO2: 19 mmol/L — ABNORMAL LOW (ref 22–32)
Calcium: 9.6 mg/dL (ref 8.9–10.3)
Chloride: 102 mmol/L (ref 98–111)
Creatinine, Ser: 1.15 mg/dL (ref 0.61–1.24)
GFR, Estimated: >60 mL/min (ref >60)
Glucose, Bld: 107 mg/dL — ABNORMAL HIGH (ref 70–99)
Potassium: 3.5 mmol/L (ref 3.5–5.1)
Sodium: 137 mmol/L (ref 135–145)
Total Bilirubin: 0.8 mg/dL (ref 0.3–1.2)
Total Protein: 7.1 g/dL (ref 6.5–8.1)

## 2022-06-03 LAB — CBC WITH DIFFERENTIAL/PLATELET
Abs Immature Granulocytes: 0.01 10*3/uL (ref 0.00–0.07)
Basophils Absolute: 0.1 10*3/uL (ref 0.0–0.1)
Basophils Relative: 1 %
Eosinophils Absolute: 0.2 10*3/uL (ref 0.0–0.5)
Eosinophils Relative: 3 %
HCT: 44.9 % (ref 39.0–52.0)
Hemoglobin: 15.5 g/dL (ref 13.0–17.0)
Immature Granulocytes: 0 %
Lymphocytes Relative: 19 %
Lymphs Abs: 1.2 10*3/uL (ref 0.7–4.0)
MCH: 31.3 pg (ref 26.0–34.0)
MCHC: 34.5 g/dL (ref 30.0–36.0)
MCV: 90.5 fL (ref 80.0–100.0)
Monocytes Absolute: 0.6 10*3/uL (ref 0.1–1.0)
Monocytes Relative: 10 %
Neutro Abs: 4.3 10*3/uL (ref 1.7–7.7)
Neutrophils Relative %: 67 %
Platelets: 178 10*3/uL (ref 150–400)
RBC: 4.96 MIL/uL (ref 4.22–5.81)
RDW: 13.6 % (ref 11.5–15.5)
WBC: 6.3 10*3/uL (ref 4.0–10.5)
nRBC: 0 % (ref 0.0–0.2)

## 2022-06-03 LAB — MAGNESIUM
Magnesium: 1.9 mg/dL (ref 1.7–2.4)
Magnesium: 1.9 mg/dL (ref 1.7–2.4)

## 2022-06-03 LAB — T4, FREE: Free T4: 0.74 ng/dL (ref 0.61–1.12)

## 2022-06-03 LAB — TROPONIN I (HIGH SENSITIVITY)
Troponin I (High Sensitivity): 6 ng/L (ref <18)
Troponin I (High Sensitivity): 36 ng/L — ABNORMAL HIGH (ref <18)

## 2022-06-03 LAB — TSH: TSH: 2.472 u[IU]/mL (ref 0.350–4.500)

## 2022-06-03 MED ORDER — HEPARIN BOLUS VIA INFUSION
4000.0000 [IU] | Freq: Once | INTRAVENOUS | Status: AC
  Administered 2022-06-03: 4000 [IU] via INTRAVENOUS
  Filled 2022-06-03: qty 4000

## 2022-06-03 MED ORDER — SODIUM CHLORIDE 0.9% FLUSH: 3.0000 mL | INTRAVENOUS | Status: DC | PRN

## 2022-06-03 MED ORDER — ONDANSETRON HCL 4 MG/2ML IJ SOLN: 4.0000 mg | Freq: Four times a day (QID) | INTRAMUSCULAR | Status: DC | PRN

## 2022-06-03 MED ORDER — SODIUM CHLORIDE 0.9 % IV SOLN: 250.0000 mL | INTRAVENOUS | Status: DC | PRN

## 2022-06-03 MED ORDER — LACTATED RINGERS IV BOLUS
500.0000 mL | Freq: Once | INTRAVENOUS | Status: AC
  Administered 2022-06-03: 500 mL via INTRAVENOUS

## 2022-06-03 MED ORDER — POTASSIUM CHLORIDE CRYS ER 20 MEQ PO TBCR
40.0000 meq | EXTENDED_RELEASE_TABLET | Freq: Once | ORAL | Status: AC
Start: 2022-06-03 — End: 2022-06-03
  Administered 2022-06-03: 40 meq via ORAL
  Filled 2022-06-03: qty 2

## 2022-06-03 MED ORDER — SODIUM CHLORIDE 0.9% FLUSH
3.0000 mL | Freq: Two times a day (BID) | INTRAVENOUS | Status: DC
  Administered 2022-06-04: 3 mL via INTRAVENOUS

## 2022-06-03 MED ORDER — DIAZEPAM 5 MG PO TABS: 5.0000 mg | ORAL_TABLET | Freq: Every day | ORAL | Status: DC | PRN

## 2022-06-03 MED ORDER — TAMSULOSIN HCL 0.4 MG PO CAPS
0.4000 mg | ORAL_CAPSULE | Freq: Every day | ORAL | Status: DC
  Administered 2022-06-04: 0.4 mg via ORAL
  Filled 2022-06-03: qty 1

## 2022-06-03 MED ORDER — DILTIAZEM HCL 25 MG/5ML IV SOLN
20.0000 mg | Freq: Once | INTRAVENOUS | Status: AC
  Administered 2022-06-03: 20 mg via INTRAVENOUS
  Filled 2022-06-03: qty 5

## 2022-06-03 MED ORDER — HEPARIN (PORCINE) 25000 UT/250ML-% IV SOLN
1100.0000 [IU]/h | INTRAVENOUS | Status: DC
  Administered 2022-06-03: 1100 [IU]/h via INTRAVENOUS
  Filled 2022-06-03: qty 250

## 2022-06-03 MED ORDER — DILTIAZEM HCL-DEXTROSE 125-5 MG/125ML-% IV SOLN (PREMIX)
5.0000 mg/h | INTRAVENOUS | Status: DC
  Administered 2022-06-03: 5 mg/h via INTRAVENOUS
  Filled 2022-06-03: qty 125

## 2022-06-03 MED ORDER — ACETAMINOPHEN 325 MG PO TABS: 650.0000 mg | ORAL_TABLET | ORAL | Status: DC | PRN

## 2022-06-03 MED ORDER — SODIUM CHLORIDE 0.9 % IV BOLUS
1000.0000 mL | Freq: Once | INTRAVENOUS | Status: AC
  Administered 2022-06-03: 1000 mL via INTRAVENOUS

## 2022-06-03 MED ORDER — METOPROLOL TARTRATE 5 MG/5ML IV SOLN
5.0000 mg | INTRAVENOUS | Status: DC | PRN
Start: 2022-06-03 — End: 2022-06-04

## 2022-06-03 NOTE — ED Triage Notes (Signed)
Pt was sitting when he felt his heart began to race. States he has hx of anxiety and has had this feeling before. Denies CP or SOB. HR going from 80-140 in triage. No hx of afib

## 2022-06-03 NOTE — ED Notes (Signed)
Provider at bedside and aware of patient's HR increasing to 180's.

## 2022-06-03 NOTE — ED Notes (Signed)
Patient resting comfortably at this time. No complaints of chest pain or shortness of breath. Patient remains GCS 15 and answering all questions appropriately. Bed in low and locked position and call bell within reach.

## 2022-06-03 NOTE — ED Notes (Signed)
Patient remains GCS 15 at this time. No complaints of chest pain or SOB. Patient states he feels like his heart is racing out of his chest otherwise no other complaints.

## 2022-06-03 NOTE — Progress Notes (Addendum)
ANTICOAGULATION CONSULT NOTE - Initial Consult  Pharmacy Consult for heparin Indication: atrial fibrillation  No Known Allergies  Patient Measurements: Height: '5\' 9"'$  (175.3 cm) Weight: 74.8 kg (165 lb) IBW/kg (Calculated) : 70.7 Heparin Dosing Weight: 74.8kg  Vital Signs: Temp: 98 F (36.7 C) (09/06 1926) Temp Source: Oral (09/06 1926) BP: 137/88 (09/06 1910) Pulse Rate: 144 (09/06 1910)  Labs: Recent Labs    06/03/22 1609  HGB 15.5  HCT 44.9  PLT 178  CREATININE 1.15  TROPONINIHS 6    Estimated Creatinine Clearance: 52.9 mL/min (by C-G formula based on SCr of 1.15 mg/dL).   Medical History: Past Medical History:  Diagnosis Date   Anxiety     Medications:  (Not in a hospital admission)  Scheduled:   potassium chloride  40 mEq Oral Once   sodium chloride flush  3 mL Intravenous Q12H   [START ON 06/04/2022] tamsulosin  0.4 mg Oral QPC breakfast   triamcinolone acetonide  10 mg Other Once    Assessment: 57 yoM with PMH of anxiety/panic attacks, HTN, BPH presented with palpitations. Pharmacy was consulted to dose heparin for afib in this patient. CBC stable, no PTA anticoagulation, Scr 1.15.  Goal of Therapy:  Heparin level 0.3-0.7 units/ml Monitor platelets by anticoagulation protocol: Yes   Plan:  Give 4000 units bolus x 1 Start heparin infusion at 1100 units/hr Check anti-Xa level in 8 hours and daily while on heparin Continue to monitor H&H and platelets  Sandford Craze, PharmD. [facility] Acute Care PGY-1 06/03/2022 7:55 PM

## 2022-06-03 NOTE — ED Notes (Addendum)
This RN spoke with admitting provider Nevada Crane, per provider ok to turn off cardizem drip at this time. Patient remains in NSR with HR of 59. Will make next shift RN aware of same.

## 2022-06-03 NOTE — ED Notes (Signed)
Patient noted to have converted to NSR on cardiac monitor with HR of 62. This RN paused cardizem drip at this time and repeat EKG collected and exported into patient's chart. Admitting provider paged at this time and notified of same. No complaints of chest pain or SOB from patient and patient remains GCS 15.

## 2022-06-03 NOTE — ED Provider Notes (Signed)
[facility] EMERGENCY DEPARTMENT Provider Note   CSN: [id number] Arrival date & time: 06/03/22  1530     History  Chief Complaint  Patient presents with   Palpitations    Juan Butler is a 76 y.o. male otherwise a healthy here presenting with palpitations.  Patient states that he has history of anxiety and takes Valium as needed.  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  He states that he felt that he just has panic attacks and occasionally has palpitations.  He states that it came ago and today around 3 PM he noticed that his heart rate is persistently elevated and he felt very anxious.  Denies any chest pain or shortness of breath.  Denies any history of A-fib.  Patient was noted to have rapid A-fib in triage  The history is provided by the patient.       Home Medications Prior to Admission medications   Not on File      Allergies    Patient has no known allergies.    Review of Systems   Review of Systems  Cardiovascular:  Positive for palpitations.  All other systems reviewed and are negative.   Physical Exam Updated Vital Signs BP (!) 134/93   Pulse (!) 111   Temp 97.7 F (36.5 C)   Resp (!) 25   Ht '5\' 9"'$  (1.753 m)   Wt 74.8 kg   SpO2 98%   BMI 24.37 kg/m  Physical Exam Vitals and nursing note reviewed.  Constitutional:      Comments: Anxious  HENT:     Head: Normocephalic.     Nose: Nose normal.     Mouth/Throat:     Mouth: Mucous membranes are moist.  Eyes:     Extraocular Movements: Extraocular movements intact.     Pupils: Pupils are equal, round, and reactive to light.  Cardiovascular:     Rate and Rhythm: Tachycardia present. Rhythm irregular.     Pulses: Normal pulses.  Pulmonary:     Effort: Pulmonary effort is normal.     Breath sounds: Normal breath sounds.  Abdominal:     General: Abdomen is flat.     Palpations: Abdomen is soft.  Musculoskeletal:        General: Normal range of motion.      Cervical back: Normal range of motion and neck supple.  Skin:    General: Skin is warm.     Capillary Refill: Capillary refill takes less than 2 seconds.  Neurological:     General: No focal deficit present.     Mental Status: He is oriented to person, place, and time.  Psychiatric:        Mood and Affect: Mood normal.        Behavior: Behavior normal.     ED Results / Procedures / Treatments   Labs (all labs ordered are listed, but only abnormal results are displayed) Labs Reviewed  CBC WITH DIFFERENTIAL/PLATELET  COMPREHENSIVE METABOLIC PANEL  MAGNESIUM  TSH  TROPONIN I (HIGH SENSITIVITY)    EKG EKG Interpretation  Date/Time:  Wednesday June 03 2022 15:36:33 EDT Ventricular Rate:  167 PR Interval:    QRS Duration: 94 QT Interval:  296 QTC Calculation: 493 R Axis:   47 Text Interpretation: Critical Test Result: Arrhythmia Atrial fibrillation with rapid ventricular response with premature ventricular or aberrantly conducted complexes Marked ST abnormality, possible inferior subendocardial injury Abnormal ECG No previous ECGs available rate related  changes Confirmed by Wandra Arthurs ([id number]) on 06/03/2022 3:56:49 PM  Radiology DG Chest Port 1 View  Result Date: 06/03/2022 CLINICAL DATA:  Palpitation EXAM: PORTABLE CHEST 1 VIEW COMPARISON:  Portable exam 1696 hours without priors for comparison FINDINGS: Normal heart size, mediastinal contours, and pulmonary vascularity. Lungs hyperinflated but clear. No pulmonary infiltrate, pleural effusion, or pneumothorax. Osseous demineralization. IMPRESSION: Hyperinflated lungs without acute abnormalities. Electronically Signed   By: Lavonia Dana M.D.   On: 06/03/2022 16:23    Procedures Procedures    CRITICAL CARE Performed by: Wandra Arthurs   Total critical care time: 30 minutes  Critical care time was exclusive of separately billable procedures and treating other patients.  Critical care was necessary to treat or prevent  imminent or life-threatening deterioration.  Critical care was time spent personally by me on the following activities: development of treatment plan with patient and/or surrogate as well as nursing, discussions with consultants, evaluation of patient's response to treatment, examination of patient, obtaining history from patient or surrogate, ordering and performing treatments and interventions, ordering and review of laboratory studies, ordering and review of radiographic studies, pulse oximetry and re-evaluation of patient's condition.   Medications Ordered in ED Medications  diltiazem (CARDIZEM) 125 mg in dextrose 5% 125 mL (1 mg/mL) infusion (has no administration in time range)  lactated ringers bolus 500 mL (0 mLs Intravenous Stopped 06/03/22 1644)  diltiazem (CARDIZEM) injection 20 mg (20 mg Intravenous Given 06/03/22 1603)    ED Course/ Medical Decision Making/ A&P                           Medical Decision Making Juan Butler is a 76 y.o. male here presenting with palpitations and anxiety.  Patient felt that he was just anxious.  He was noted to be in new onset rapid A-fib on arrival.  It is unclear how long he has been in A-fib.  He is also not anticoagulated.  Since he has unknown onset, I hesitate to perform a cardioversion.  We will check electrolytes and also will give Cardizem bolus and may need to start a drip.  4:51 PM Patient's heart rate still in the 120s down from the 150s after Cardizem bolus.  Will start Cardizem drip.   6 PM  I reviewed patient's labs.  Patient's bicarb is 19.  Otherwise electrolytes were normal.  TSH is normal.  Chest x-ray is clear.  Patient's heart rate is in the low 100s on Cardizem drip.  Patient will be admitted for rapid A-fib   Problems Addressed: Atrial fibrillation with RVR ([place]): acute illness or injury  Amount and/or Complexity of Data Reviewed Labs: ordered. Decision-making details documented in ED Course. Radiology: ordered and  independent interpretation performed. Decision-making details documented in ED Course. ECG/medicine tests: ordered and independent interpretation performed. Decision-making details documented in ED Course.  Risk Prescription drug management. Decision regarding hospitalization.    Final Clinical Impression(s) / ED Diagnoses Final diagnoses:  None    Rx / DC Orders ED Discharge Orders     None         Drenda Freeze, MD 06/03/22 [phone]

## 2022-06-03 NOTE — H&P (Signed)
History and Physical    VISHWA DAIS IOX:[id number] DOB: 10-Jun-1946 DOA: 06/03/2022  PCP: Wenda Low, MD (Confirm with patient/family/NH records and if not entered, this has to be entered at [facility] point of entry) Patient coming from: Home  I have personally briefly reviewed patient's old medical records in [place]  Chief Complaint: Palpitations  HPI: Juan Butler is a 76 y.o. male with medical history significant of anxiety/panic attacks, HTN, BPH, presented with palpitations.  Patient has a history of " tachycardia" usually associated with panic attacks for which has been taking as needed Valium, but he reported no such episode recently month.  Patient does volunteer works at [employer], this afternoon, while pushing a chair, patient started to feel severe palpitations and some lightheadedness and came down to ED.  He denies any chest pain, no shortness of breath no leg swelling, no recent medication changes denies any night sweat losing weight.  No history of GI bleed or stroke or heart attack.  He does take meloxicam periodically for uncontrolled left knee OA.  And he does receive every 6 months left knee cortisone shot with orthopedic surgery.  No history of GI bleed.  ED Course: Patient was found to be in rapid A-fib, K3.5, Cardizem drip started in ED.  Patient also received total of 1.5 L IV bolus.  Hemoglobin 15.5.  Review of Systems: As per HPI otherwise 14 point review of systems negative.    Past Medical History:  Diagnosis Date   Anxiety     History reviewed. No pertinent surgical history.   reports that he has never smoked. He has never used smokeless tobacco. He reports current alcohol use. He reports that he does not use drugs.  No Known Allergies  History reviewed. No pertinent family history.   Prior to Admission medications   Medication Sig Start Date End Date Taking? Authorizing Provider  amLODipine (NORVASC) 5 MG tablet Take 5 mg by mouth daily.  03/05/22   [provider]  diazepam (VALIUM) 5 MG tablet Take 5 mg by mouth daily as needed for anxiety. 05/16/22   [provider]  meloxicam (MOBIC) 15 MG tablet Take 15 mg by mouth daily. 05/07/22   [provider]    Physical Exam: Vitals:   06/03/22 1845 06/03/22 1853 06/03/22 1855 06/03/22 1856  BP:      Pulse: (!) 135 (!) 134 89 72  Resp: (!) 24 (!) 24 16 (!) 32  Temp:      SpO2: 94% 96% 97% 98%  Weight:      Height:        Constitutional: NAD, calm, comfortable Vitals:   06/03/22 1845 06/03/22 1853 06/03/22 1855 06/03/22 1856  BP:      Pulse: (!) 135 (!) 134 89 72  Resp: (!) 24 (!) 24 16 (!) 32  Temp:      SpO2: 94% 96% 97% 98%  Weight:      Height:       Eyes: PERRL, lids and conjunctivae normal ENMT: Mucous membranes are moist. Posterior pharynx clear of any exudate or lesions.Normal dentition.  Neck: normal, supple, no masses, no thyromegaly Respiratory: clear to auscultation bilaterally, no wheezing, no crackles. Normal respiratory effort. No accessory muscle use.  Cardiovascular: Tachycardia, no murmurs / rubs / gallops. No extremity edema. 2+ pedal pulses. No carotid bruits.  Abdomen: no tenderness, no masses palpated. No hepatosplenomegaly. Bowel sounds positive.  Musculoskeletal: no clubbing / cyanosis. No joint deformity upper and lower  extremities. Good ROM, no contractures. Normal muscle tone.  Skin: no rashes, lesions, ulcers. No induration Neurologic: CN 2-12 grossly intact. Sensation intact, DTR normal. Strength 5/5 in all 4.  Psychiatric: Normal judgment and insight. Alert and oriented x 3. Normal mood.     Labs on Admission: I have personally reviewed following labs and imaging studies  CBC: Recent Labs  Lab 06/03/22 1609  WBC 6.3  NEUTROABS 4.3  HGB 15.5  HCT 44.9  MCV 90.5  PLT 829   Basic Metabolic Panel: Recent Labs  Lab 06/03/22 1609  NA 137  K 3.5  CL 102  CO2 19*  GLUCOSE 107*  BUN 18  CREATININE  1.15  CALCIUM 9.6  MG 1.9   GFR: Estimated Creatinine Clearance: 52.9 mL/min (by C-G formula based on SCr of 1.15 mg/dL). Liver Function Tests: Recent Labs  Lab 06/03/22 1609  AST 33  ALT 23  ALKPHOS 52  BILITOT 0.8  PROT 7.1  ALBUMIN 4.4   No results for input(s): "LIPASE", "AMYLASE" in the last 168 hours. No results for input(s): "AMMONIA" in the last 168 hours. Coagulation Profile: No results for input(s): "INR", "PROTIME" in the last 168 hours. Cardiac Enzymes: No results for input(s): "CKTOTAL", "CKMB", "CKMBINDEX", "TROPONINI" in the last 168 hours. BNP (last 3 results) No results for input(s): "PROBNP" in the last 8760 hours. HbA1C: No results for input(s): "HGBA1C" in the last 72 hours. CBG: No results for input(s): "GLUCAP" in the last 168 hours. Lipid Profile: No results for input(s): "CHOL", "HDL", "LDLCALC", "TRIG", "CHOLHDL", "LDLDIRECT" in the last 72 hours. Thyroid Function Tests: Recent Labs    06/03/22 1609  TSH 2.472   Anemia Panel: No results for input(s): "VITAMINB12", "FOLATE", "FERRITIN", "TIBC", "IRON", "RETICCTPCT" in the last 72 hours. Urine analysis: No results found for: "COLORURINE", "APPEARANCEUR", "LABSPEC", "PHURINE", "GLUCOSEU", "HGBUR", "BILIRUBINUR", "KETONESUR", "PROTEINUR", "UROBILINOGEN", "NITRITE", "LEUKOCYTESUR"  Radiological Exams on Admission: DG Chest Port 1 View  Result Date: 06/03/2022 CLINICAL DATA:  Palpitation EXAM: PORTABLE CHEST 1 VIEW COMPARISON:  Portable exam 1614 hours without priors for comparison FINDINGS: Normal heart size, mediastinal contours, and pulmonary vascularity. Lungs hyperinflated but clear. No pulmonary infiltrate, pleural effusion, or pneumothorax. Osseous demineralization. IMPRESSION: Hyperinflated lungs without acute abnormalities. Electronically Signed   By: Lavonia Dana M.D.   On: 06/03/2022 16:23    EKG: Independently reviewed.  Rapid A-fib  Assessment/Plan Principal Problem:   A-fib  [facility]) Active Problems:   Anxiety   New onset a-fib (HCC)  (please populate well all problems here in Problem List. (For example, if patient is on BP meds at home and you resume or decide to hold them, it is a problem that needs to be her. Same for CAD, COPD, HLD and so on)  A-fib with RVR -Likely acute on chronic, suspect patient does have a baseline PAF, undetected. -Continue Cardizem drip, as patient does not have any symptoms signs of new onset CHF at this point.  And as needed Lopressor for breakthrough heart rate. -Discussed with patient regarding if heart rate not controlled overnight, may need cardiology intervention of cardioversion however probably would like patient to be on anticoagulation at least 24-48 hours before cardioversion. -CHADS2=2, starting heparin drip, bridging for Eliquis. -TSH, T3 and T4, echocardiogram when heart rate controlled.  HTN -Hold off amlodipine, as patient on Cardizem drip  BPH -Continue daily Flomax  Anxiety/panic attack -Denied in panic attack today.  DVT prophylaxis: Heparin drip Code Status: Full code Family Communication: None at bedside Disposition Plan:  Depends on clinical progress, expect 1 to 2 days hospital stay Consults called: None Admission status: PCU for Cardizem drip   Lequita Halt MD [facility] Pager [phone]  06/03/2022, 7:05 PM

## 2022-06-04 ENCOUNTER — Telehealth (HOSPITAL_COMMUNITY): Payer: Self-pay | Admitting: Pharmacy Technician

## 2022-06-04 ENCOUNTER — Other Ambulatory Visit (HOSPITAL_COMMUNITY): Payer: Self-pay

## 2022-06-04 ENCOUNTER — Observation Stay (HOSPITAL_BASED_OUTPATIENT_CLINIC_OR_DEPARTMENT_OTHER): Payer: Medicare Other

## 2022-06-04 DIAGNOSIS — F419 Anxiety disorder, unspecified: Secondary | ICD-10-CM

## 2022-06-04 DIAGNOSIS — I4891 Unspecified atrial fibrillation: Secondary | ICD-10-CM | POA: Diagnosis not present

## 2022-06-04 DIAGNOSIS — R9431 Abnormal electrocardiogram [ECG] [EKG]: Secondary | ICD-10-CM | POA: Diagnosis not present

## 2022-06-04 LAB — HEPARIN LEVEL (UNFRACTIONATED): Heparin Unfractionated: 0.45 IU/mL (ref 0.30–0.70)

## 2022-06-04 LAB — CBC
HCT: 46.4 % (ref 39.0–52.0)
Hemoglobin: 15.7 g/dL (ref 13.0–17.0)
MCH: 31.2 pg (ref 26.0–34.0)
MCHC: 33.8 g/dL (ref 30.0–36.0)
MCV: 92.1 fL (ref 80.0–100.0)
Platelets: 188 10*3/uL (ref 150–400)
RBC: 5.04 MIL/uL (ref 4.22–5.81)
RDW: 14 % (ref 11.5–15.5)
WBC: 4.8 10*3/uL (ref 4.0–10.5)
nRBC: 0 % (ref 0.0–0.2)

## 2022-06-04 LAB — ECHOCARDIOGRAM COMPLETE
Area-P 1/2: 2.76 cm2
Height: 69 in
S' Lateral: 2.7 cm
Weight: 2640 oz

## 2022-06-04 LAB — BASIC METABOLIC PANEL
Anion gap: 10 (ref 5–15)
BUN: 13 mg/dL (ref 8–23)
CO2: 23 mmol/L (ref 22–32)
Calcium: 9.2 mg/dL (ref 8.9–10.3)
Chloride: 107 mmol/L (ref 98–111)
Creatinine, Ser: 1 mg/dL (ref 0.61–1.24)
GFR, Estimated: >60 mL/min (ref >60)
Glucose, Bld: 113 mg/dL — ABNORMAL HIGH (ref 70–99)
Potassium: 4 mmol/L (ref 3.5–5.1)
Sodium: 140 mmol/L (ref 135–145)

## 2022-06-04 MED ORDER — APIXABAN 5 MG PO TABS
5.0000 mg | ORAL_TABLET | Freq: Two times a day (BID) | ORAL | 0 refills | Status: DC
  Filled 2022-06-04: qty 60, 30d supply, fill #0

## 2022-06-04 MED ORDER — METOPROLOL TARTRATE 25 MG PO TABS
12.5000 mg | ORAL_TABLET | Freq: Two times a day (BID) | ORAL | 0 refills | Status: DC
Start: 2022-06-04 — End: 2022-06-09
  Filled 2022-06-04: qty 30, 30d supply, fill #0

## 2022-06-04 MED ORDER — METOPROLOL TARTRATE 25 MG PO TABS
12.5000 mg | ORAL_TABLET | Freq: Two times a day (BID) | ORAL | Status: DC
  Administered 2022-06-04: 12.5 mg via ORAL
  Filled 2022-06-04: qty 1

## 2022-06-04 MED ORDER — APIXABAN 5 MG PO TABS
5.0000 mg | ORAL_TABLET | Freq: Two times a day (BID) | ORAL | Status: DC
  Administered 2022-06-04: 5 mg via ORAL
  Filled 2022-06-04: qty 1

## 2022-06-04 NOTE — Progress Notes (Signed)
Heart Failure Navigator Progress Note  Assessed for Heart & Vascular TOC clinic readiness.  Patient does not meet criteria due to EF 70-75 % , not acute heart failure related. .   Navigator available for reassessment of patient.   Juan Butler, BSN, Clinical cytogeneticist Only

## 2022-06-04 NOTE — Hospital Course (Signed)
76 y.o. male with medical history significant of anxiety/panic attacks, HTN, BPH, presented with palpitations.   Patient has a history of " tachycardia" usually associated with panic attacks for which has been taking as needed Valium, but he reported no such episode recently month.  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  He denies any chest pain, no shortness of breath no leg swelling, no recent medication changes denies any night sweat losing weight.  No history of GI bleed or stroke or heart attack.  He does take meloxicam periodically for uncontrolled left knee OA.  And he does receive every 6 months left knee cortisone shot with orthopedic surgery.  No history of GI bleed.   ED Course: Patient was found to be in rapid A-fib, K3.5, Cardizem drip started in ED

## 2022-06-04 NOTE — Progress Notes (Signed)
[facility] for heparin ->apixaban Indication: atrial fibrillation  No Known Allergies  Patient Measurements: Height: '5\' 9"'$  (175.3 cm) Weight: 74.8 kg (165 lb) IBW/kg (Calculated) : 70.7 Heparin Dosing Weight: 74.8kg  Vital Signs: Temp: 98.1 F (36.7 C) (09/07 0815) Temp Source: Oral (09/07 0815) BP: 134/68 (09/07 0815) Pulse Rate: 64 (09/07 0815)  Labs: Recent Labs    06/03/22 1609 06/03/22 2055 06/04/22 0754  HGB 15.5  --  15.7  HCT 44.9  --  46.4  PLT 178  --  188  HEPARINUNFRC  --   --  0.45  CREATININE 1.15  --  1.00  TROPONINIHS 6 36*  --      Estimated Creatinine Clearance: 60.9 mL/min (by C-G formula based on SCr of 1 mg/dL).   Medical History: Past Medical History:  Diagnosis Date   Anxiety     Assessment: 62 yoM with PMH of anxiety/panic attacks, HTN, BPH presented with palpitations. Pharmacy was consulted to dose heparin for afib in this patient. No AC PTA.  Heparin level 0.45 (therapeutic) on heparin at 1100 units/hr. No bleeding noted.  Goal of Therapy:  Heparin level 0.3-0.7 units/ml Monitor platelets by anticoagulation protocol: Yes   Plan:  Continue heparin at 1100 units/hr Will f/u 6hr level to confirm therapeutic  Sherlon Handing, PharmD, BCPS Please see amion for complete clinical pharmacist phone list 06/04/2022 10:47 AM   ADDENDUM (1045): Consult to transition to apixaban.  Plan: Apixaban '5mg'$  po BID Will d/c heparin when first dose of apixaban given Pt's copay is $47.00 for apixaban  Sherlon Handing, PharmD, BCPS Please see amion for complete clinical pharmacist phone list 06/04/2022 10:49 AM

## 2022-06-04 NOTE — TOC Benefit Eligibility Note (Signed)
Patient Advocate Encounter  Insurance verification completed.    The patient is currently admitted and upon discharge could be taking Eliquis 5 mg.  The current 30 day co-pay is $47.00.   The patient is insured through [employer] Medicare Part D     Vickey Boak, CPhT Pharmacy Patient Advocate Specialist [facility] Patient Advocate Team Direct Number: [phone]  Fax: [phone]        

## 2022-06-04 NOTE — Progress Notes (Signed)
ANTICOAGULATION CONSULT NOTE  Pharmacy Consult for heparin Indication: atrial fibrillation  No Known Allergies  Patient Measurements: Height: '5\' 9"'$  (175.3 cm) Weight: 74.8 kg (165 lb) IBW/kg (Calculated) : 70.7 Heparin Dosing Weight: 74.8kg  Vital Signs: Temp: 98.1 F (36.7 C) (09/07 0815) Temp Source: Oral (09/07 0815) BP: 134/68 (09/07 0815) Pulse Rate: 64 (09/07 0815)  Labs: Recent Labs    06/03/22 1609 06/03/22 2055 06/04/22 0754  HGB 15.5  --  15.7  HCT 44.9  --  46.4  PLT 178  --  188  HEPARINUNFRC  --   --  0.45  CREATININE 1.15  --  1.00  TROPONINIHS 6 36*  --      Estimated Creatinine Clearance: 60.9 mL/min (by C-G formula based on SCr of 1 mg/dL).   Medical History: Past Medical History:  Diagnosis Date   Anxiety     Assessment: 56 yoM with PMH of anxiety/panic attacks, HTN, BPH presented with palpitations. Pharmacy was consulted to dose heparin for afib in this patient. No AC PTA.  Heparin level 0.45 (therapeutic) on heparin at 1100 units/hr. No bleeding noted.  Goal of Therapy:  Heparin level 0.3-0.7 units/ml Monitor platelets by anticoagulation protocol: Yes   Plan:  Continue heparin at 1100 units/hr Will f/u 6hr level to confirm therapeutic  Sherlon Handing, PharmD, BCPS Please see amion for complete clinical pharmacist phone list 06/04/2022 9:02 AM

## 2022-06-04 NOTE — Telephone Encounter (Signed)
Pharmacy Patient Advocate Encounter  Insurance verification completed.    The patient is insured through [employer] Medicare Part D   The patient is currently admitted and ran test claims for the following: Eliquis.  Copays and coinsurance results were relayed to Inpatient clinical team.  

## 2022-06-04 NOTE — ED Notes (Signed)
Patient set up for lunch.

## 2022-06-04 NOTE — Discharge Summary (Signed)
Physician Discharge Summary   Patient: Juan Butler MRN: [id number] DOB: [birth year]  Admit date:     06/03/2022  Discharge date: 06/04/22  Discharge Physician: Marylu Lund   PCP: Wenda Low, MD   Recommendations at discharge:    Follow up with PCP in 1-2 weeks  Discharge Diagnoses: Principal Problem:   A-fib [facility]) Active Problems:   Anxiety   New onset a-fib ([place])  Resolved Problems:   * No resolved hospital problems. *  Hospital Course: 76 y.o. male with medical history significant of anxiety/panic attacks, HTN, BPH, presented with palpitations.   Patient has a history of " tachycardia" usually associated with panic attacks for which has been taking as needed Valium, but he reported no such episode recently month.  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  He denies any chest pain, no shortness of breath no leg swelling, no recent medication changes denies any night sweat losing weight.  No history of GI bleed or stroke or heart attack.  He does take meloxicam periodically for uncontrolled left knee OA.  And he does receive every 6 months left knee cortisone shot with orthopedic surgery.  No history of GI bleed.   ED Course: Patient was found to be in rapid A-fib, K3.5, Cardizem drip started in ED  Assessment and Plan: A-fib with RVR -Likely acute on chronic, suspect patient does have a baseline PAF, undetected. -Initially continued on Cardizem drip. Later converted to NSR with bradycardia and cardizem stopped -Pt started on low dose metoprolol 12.'5mg'$  bid and remained stable -CHADS-VASc2 of 3. Pt initially on heparin, transitioned to eliquis on d/c -Have discussed with Cardiology to establish with outpatient afib clinic  HTN -Hold off amlodipine -transitioned to metoprolol   BPH -Continue daily Flomax   Anxiety/panic attack -Denied  in panic attack while in hospital       Consultants:  Procedures performed:   Disposition: Home Diet recommendation:  Cardiac diet DISCHARGE MEDICATION: Allergies as of 06/04/2022   No Known Allergies      Medication List     STOP taking these medications    amLODipine 5 MG tablet Commonly known as: NORVASC       TAKE these medications    apixaban 5 MG Tabs tablet Commonly known as: ELIQUIS Take 1 tablet (5 mg total) by mouth 2 (two) times daily.   diazepam 5 MG tablet Commonly known as: VALIUM Take 5 mg by mouth daily as needed for anxiety.   meloxicam 15 MG tablet Commonly known as: MOBIC Take 15 mg by mouth daily as needed for pain.   metoprolol tartrate 25 MG tablet Commonly known as: LOPRESSOR Take 0.5 tablets (12.5 mg total) by mouth 2 (two) times daily.   multivitamin tablet Take 1 tablet by mouth daily.   tamsulosin 0.4 MG Caps capsule Commonly known as: FLOMAX Take 0.4 mg by mouth daily.        Follow-up Information     Wenda Low, MD Follow up in 2 week(s).   Specialty: Internal Medicine Why: Hospital follow up Contact information: [address] [place] [address] [phone]                Discharge Exam: Danley Danker Weights   06/03/22 1536  Weight: 74.8 kg   General exam: Awake, laying in bed, in nad Respiratory system: Normal respiratory effort, no wheezing Cardiovascular  system: regular rate, s1, s2 Gastrointestinal system: Soft, nondistended, positive BS Central nervous system: CN2-12 grossly intact, strength intact Extremities: Perfused, no clubbing Skin: Normal skin turgor, no notable skin lesions seen Psychiatry: Mood normal // no visual hallucinations   Condition at discharge: fair  The results of significant diagnostics from this hospitalization (including imaging, microbiology, ancillary and laboratory) are listed below for reference.   Imaging Studies: DG Chest Port 1 View  Result Date:  06/03/2022 CLINICAL DATA:  Palpitation EXAM: PORTABLE CHEST 1 VIEW COMPARISON:  Portable exam 1614 hours without priors for comparison FINDINGS: Normal heart size, mediastinal contours, and pulmonary vascularity. Lungs hyperinflated but clear. No pulmonary infiltrate, pleural effusion, or pneumothorax. Osseous demineralization. IMPRESSION: Hyperinflated lungs without acute abnormalities. Electronically Signed   By: Lavonia Dana M.D.   On: 06/03/2022 16:23    Microbiology: No results found for this or any previous visit.  Labs: CBC: Recent Labs  Lab 06/03/22 1609 06/04/22 0754  WBC 6.3 4.8  NEUTROABS 4.3  --   HGB 15.5 15.7  HCT 44.9 46.4  MCV 90.5 92.1  PLT 178 470   Basic Metabolic Panel: Recent Labs  Lab 06/03/22 1609 06/03/22 2055 06/04/22 0754  NA 137  --  140  K 3.5  --  4.0  CL 102  --  107  CO2 19*  --  23  GLUCOSE 107*  --  113*  BUN 18  --  13  CREATININE 1.15  --  1.00  CALCIUM 9.6  --  9.2  MG 1.9 1.9  --    Liver Function Tests: Recent Labs  Lab 06/03/22 1609  AST 33  ALT 23  ALKPHOS 52  BILITOT 0.8  PROT 7.1  ALBUMIN 4.4   CBG: No results for input(s): "GLUCAP" in the last 168 hours.  Discharge time spent: less than 30 minutes.  Signed: Marylu Lund, MD [facility] 06/04/2022

## 2022-06-04 NOTE — ED Notes (Signed)
Echo is at the bedside  

## 2022-06-09 ENCOUNTER — Encounter (HOSPITAL_COMMUNITY): Payer: Self-pay | Admitting: Physician Assistant

## 2022-06-09 ENCOUNTER — Ambulatory Visit (HOSPITAL_COMMUNITY)
Admit: 2022-06-09 | Discharge: 2022-06-09 | Disposition: A | Discharge status: Home / Self Care | Payer: Medicare Other | Attending: Physician Assistant | Admitting: Physician Assistant

## 2022-06-09 VITALS — BP 126/70 | HR 57 | Ht 69.0 in | Wt 165.6 lb

## 2022-06-09 DIAGNOSIS — I480 Paroxysmal atrial fibrillation: Secondary | ICD-10-CM | POA: Diagnosis not present

## 2022-06-09 DIAGNOSIS — D6869 Other thrombophilia: Secondary | ICD-10-CM | POA: Diagnosis not present

## 2022-06-09 DIAGNOSIS — F419 Anxiety disorder, unspecified: Secondary | ICD-10-CM | POA: Diagnosis not present

## 2022-06-09 DIAGNOSIS — I10 Essential (primary) hypertension: Secondary | ICD-10-CM | POA: Diagnosis not present

## 2022-06-09 DIAGNOSIS — Z7901 Long term (current) use of anticoagulants: Secondary | ICD-10-CM | POA: Insufficient documentation

## 2022-06-09 MED ORDER — APIXABAN 5 MG PO TABS: 5.0000 mg | ORAL_TABLET | Freq: Two times a day (BID) | ORAL | 3 refills | Status: DC

## 2022-06-09 MED ORDER — METOPROLOL TARTRATE 25 MG PO TABS: ORAL_TABLET | ORAL | 0 refills | Status: DC

## 2022-06-09 NOTE — Progress Notes (Signed)
Primary Care Physician: Wenda Low, MD Primary Cardiologist: none Primary Electrophysiologist: none Referring Physician: Dr Virgel Bouquet is a 76 y.o. male with a history of HTN, anxiety, atrial fibrillation who presents for consultation in the [facility].  The patient was initially diagnosed with atrial fibrillation 06/03/22 after presenting to the ED with symptoms of palpitations and lightheadedness. 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 ECG showed rapid afib and he was started on diltiazem drip which eventually converted him to SR after several hours. Patient was started on Eliquis for a CHADS2VASC score of 3 and metoprolol for rate control. Echo showed EF 70-75%, no valvular issues. In hindsight, patient may have had brief episodes of afib prior to this that he attributed to anxiety. Since discharge, he has had more lightheadedness. No bleeding issues on anticoagulation.   Today, he denies symptoms of palpitations, chest pain, shortness of breath, orthopnea, PND, lower extremity edema, presyncope, syncope, snoring, daytime somnolence, bleeding, or neurologic sequela. The patient is tolerating medications without difficulties and is otherwise without complaint today.    Atrial Fibrillation Risk Factors:  he does not have symptoms or diagnosis of sleep apnea. he does not have a history of rheumatic fever.   he has a BMI of Body mass index is 24.45 kg/m.Marland Kitchen Filed Weights   06/09/22 1427  Weight: 75.1 kg    No family history on file.   Atrial Fibrillation Management history:  Previous antiarrhythmic drugs: none Previous cardioversions: none Previous ablations: none CHADS2VASC score: 3 Anticoagulation history: Eliquis   Past Medical History:  Diagnosis Date   Anxiety    No past surgical history on file.  Current Outpatient Medications  Medication Sig Dispense Refill   diazepam (VALIUM) 5 MG  tablet Take 5 mg by mouth daily as needed for anxiety.     fluocinonide (LIDEX) 0.05 % external solution APPLY 1 ML ON THE SKIN NIGHTLY 1-2 WEEKS AS NEEDED FLARES, TAPER AS ABLE     fluticasone (CUTIVATE) 0.05 % cream APPLY 1 APPLICATION TOPICAL ONCE A DAY     ketoconazole (NIZORAL) 2 % cream APPLY 1 APPLICATION ON SKIN TWICE A DAY TO FACE AS MAINTENANCE (CAN APPLY MIXED WITH HYDROCORTISONE)     meloxicam (MOBIC) 15 MG tablet Take 15 mg by mouth daily as needed for pain.     Multiple Vitamin (MULTIVITAMIN) tablet Take 1 tablet by mouth daily.     tamsulosin (FLOMAX) 0.4 MG CAPS capsule Take 0.4 mg by mouth daily.     apixaban (ELIQUIS) 5 MG TABS tablet Take 1 tablet (5 mg total) by mouth 2 (two) times daily. 60 tablet 3   metoprolol tartrate (LOPRESSOR) 25 MG tablet Take 1 tablet by mouth every 6 hours as needed for heart racing 30 tablet 0   No current facility-administered medications for this encounter.    No Known Allergies  Social History   Socioeconomic History   Marital status: Married    Spouse name: Not on file   Number of children: Not on file   Years of education: Not on file   Highest education level: Not on file  Occupational History   Not on file  Tobacco Use   Smoking status: Never   Smokeless tobacco: Never   Tobacco comments:    Never smoke 06/09/22  Substance and Sexual Activity   Alcohol use: Yes    Alcohol/week: 5.0 standard drinks of alcohol  Types: 5 Standard drinks or equivalent per week    Comment: 1 nightly drink 06/09/22   Drug use: Never   Sexual activity: Not on file  Other Topics Concern   Not on file  Social History Narrative   Not on file   Social Determinants of Health   Financial Resource Strain: Not on file  Food Insecurity: Not on file  Transportation Needs: Not on file  Physical Activity: Not on file  Stress: Not on file  Social Connections: Not on file  Intimate Partner Violence: Not on file     ROS- All systems are reviewed  and negative except as per the HPI above.  Physical Exam: Vitals:   06/09/22 1427  BP: 126/70  Pulse: (!) 57  Weight: 75.1 kg  Height: '5\' 9"'$  (1.753 m)    GEN- The patient is a well appearing elderly male, alert and oriented x 3 today.   Head- normocephalic, atraumatic Eyes-  Sclera clear, conjunctiva pink Ears- hearing intact Oropharynx- clear Neck- supple  Lungs- Clear to ausculation bilaterally, normal work of breathing Heart- Regular rate and rhythm, no murmurs, rubs or gallops  GI- soft, NT, ND, + BS Extremities- no clubbing, cyanosis, or edema MS- no significant deformity or atrophy Skin- no rash or lesion Psych- euthymic mood, full affect Neuro- strength and sensation are intact  Wt Readings from Last 3 Encounters:  06/09/22 75.1 kg  06/03/22 74.8 kg    EKG today demonstrates  SB Vent. rate 57 BPM PR interval 176 ms QRS duration 98 ms QT/QTcB 406/395 ms  Echo 06/04/22 demonstrated   1. Left ventricular ejection fraction, by estimation, is 70 to 75%. The  left ventricle has hyperdynamic function. The left ventricle has no  regional wall motion abnormalities. Left ventricular diastolic parameters  are consistent with Grade I diastolic dysfunction (impaired relaxation).   2. Right ventricular systolic function is normal. The right ventricular  size is normal.   3. The mitral valve is normal in structure. No evidence of mitral valve  regurgitation. No evidence of mitral stenosis.   4. The aortic valve is tricuspid. Aortic valve regurgitation is not  visualized. No aortic stenosis is present.   5. The inferior vena cava is normal in size with greater than 50%  respiratory variability, suggesting right atrial pressure of 3 mmHg.   Comparison(s): No prior Echocardiogram.  Epic records are reviewed at length today  CHA2DS2-VASc Score = 3  The patient's score is based upon: CHF History: 0 HTN History: 1 Diabetes History: 0 Stroke History: 0 Vascular Disease  History: 0 Age Score: 2 Gender Score: 0       ASSESSMENT AND PLAN: 1. Paroxysmal Atrial Fibrillation (ICD10:  I48.0) The patient's CHA2DS2-VASc score is 3, indicating a 3.2% annual risk of stroke.   General education about afib provided and questions answered. We also discussed his stroke risk and the risks and benefits of anticoagulation. Continue Eliquis 5 mg BID Possible BB is making him more lightheaded since his heart rate is slower at baseline. Will decrease Lopressor to 25 mg q 6 hours PRN for heart racing. We discussed ablation for long term rhythm control. Patient would like to take some time to consider before making a decision.   2. Secondary Hypercoagulable State (ICD10:  D68.69) The patient is at significant risk for stroke/thromboembolism based upon his CHA2DS2-VASc Score of 3.  Continue Apixaban (Eliquis).   3. HTN Stable, med changes as above.    Follow up in the AF clinic  in one month.    [facility] [address] [place], [place] [address] [phone] 06/09/2022 4:42 PM

## 2022-06-10 DIAGNOSIS — I10 Essential (primary) hypertension: Secondary | ICD-10-CM | POA: Diagnosis not present

## 2022-06-10 DIAGNOSIS — I480 Paroxysmal atrial fibrillation: Secondary | ICD-10-CM | POA: Diagnosis not present

## 2022-06-15 ENCOUNTER — Telehealth (HOSPITAL_COMMUNITY): Payer: Self-pay | Admitting: *Deleted

## 2022-06-15 MED ORDER — METOPROLOL TARTRATE 25 MG PO TABS: 12.5000 mg | ORAL_TABLET | Freq: Two times a day (BID) | ORAL | 3 refills | Status: DC

## 2022-06-15 NOTE — Telephone Encounter (Signed)
Patient called in stating he feels he is tolerating metoprolol better now and would prefer to continue it daily to suppress afib rather than taking PRN. Per Adline Peals PA ok to continue daily.

## 2022-06-22 DIAGNOSIS — M25552 Pain in left hip: Secondary | ICD-10-CM | POA: Diagnosis not present

## 2022-06-22 DIAGNOSIS — M25562 Pain in left knee: Secondary | ICD-10-CM | POA: Diagnosis not present

## 2022-06-22 DIAGNOSIS — M25561 Pain in right knee: Secondary | ICD-10-CM | POA: Diagnosis not present

## 2022-06-22 DIAGNOSIS — M25551 Pain in right hip: Secondary | ICD-10-CM | POA: Diagnosis not present

## 2022-06-23 DIAGNOSIS — M25562 Pain in left knee: Secondary | ICD-10-CM | POA: Insufficient documentation

## 2022-06-23 DIAGNOSIS — M169 Osteoarthritis of hip, unspecified: Secondary | ICD-10-CM | POA: Insufficient documentation

## 2022-06-23 DIAGNOSIS — M25551 Pain in right hip: Secondary | ICD-10-CM | POA: Insufficient documentation

## 2022-06-24 DIAGNOSIS — M5136 Other intervertebral disc degeneration, lumbar region: Secondary | ICD-10-CM | POA: Diagnosis not present

## 2022-06-24 DIAGNOSIS — M9905 Segmental and somatic dysfunction of pelvic region: Secondary | ICD-10-CM | POA: Diagnosis not present

## 2022-06-24 DIAGNOSIS — M9903 Segmental and somatic dysfunction of lumbar region: Secondary | ICD-10-CM | POA: Diagnosis not present

## 2022-06-24 DIAGNOSIS — M9904 Segmental and somatic dysfunction of sacral region: Secondary | ICD-10-CM | POA: Diagnosis not present

## 2022-07-14 ENCOUNTER — Ambulatory Visit (HOSPITAL_COMMUNITY): Payer: Medicare Other | Admitting: Physician Assistant

## 2022-07-15 DIAGNOSIS — M9904 Segmental and somatic dysfunction of sacral region: Secondary | ICD-10-CM | POA: Diagnosis not present

## 2022-07-15 DIAGNOSIS — M9905 Segmental and somatic dysfunction of pelvic region: Secondary | ICD-10-CM | POA: Diagnosis not present

## 2022-07-15 DIAGNOSIS — M5136 Other intervertebral disc degeneration, lumbar region: Secondary | ICD-10-CM | POA: Diagnosis not present

## 2022-07-15 DIAGNOSIS — M9903 Segmental and somatic dysfunction of lumbar region: Secondary | ICD-10-CM | POA: Diagnosis not present

## 2022-07-21 ENCOUNTER — Ambulatory Visit (HOSPITAL_COMMUNITY)
Admission: RE | Admit: 2022-07-21 | Discharge: 2022-07-21 | Disposition: A | Discharge status: Home / Self Care | Payer: Medicare Other | Source: Ambulatory Visit | Attending: Physician Assistant | Admitting: Physician Assistant

## 2022-07-21 ENCOUNTER — Ambulatory Visit (HOSPITAL_COMMUNITY): Payer: Medicare Other | Admitting: Physician Assistant

## 2022-07-21 ENCOUNTER — Encounter (HOSPITAL_COMMUNITY): Payer: Self-pay | Admitting: Physician Assistant

## 2022-07-21 VITALS — BP 136/78 | HR 48 | Ht 69.0 in | Wt 164.8 lb

## 2022-07-21 DIAGNOSIS — D6869 Other thrombophilia: Secondary | ICD-10-CM | POA: Insufficient documentation

## 2022-07-21 DIAGNOSIS — I10 Essential (primary) hypertension: Secondary | ICD-10-CM | POA: Diagnosis not present

## 2022-07-21 DIAGNOSIS — R001 Bradycardia, unspecified: Secondary | ICD-10-CM | POA: Diagnosis not present

## 2022-07-21 DIAGNOSIS — Z7901 Long term (current) use of anticoagulants: Secondary | ICD-10-CM | POA: Insufficient documentation

## 2022-07-21 DIAGNOSIS — Z79899 Other long term (current) drug therapy: Secondary | ICD-10-CM | POA: Insufficient documentation

## 2022-07-21 DIAGNOSIS — F419 Anxiety disorder, unspecified: Secondary | ICD-10-CM | POA: Insufficient documentation

## 2022-07-21 DIAGNOSIS — I480 Paroxysmal atrial fibrillation: Secondary | ICD-10-CM | POA: Insufficient documentation

## 2022-07-21 LAB — CBC
HCT: 44.9 % (ref 39.0–52.0)
Hemoglobin: 15.6 g/dL (ref 13.0–17.0)
MCH: 32.2 pg (ref 26.0–34.0)
MCHC: 34.7 g/dL (ref 30.0–36.0)
MCV: 92.8 fL (ref 80.0–100.0)
Platelets: 178 10*3/uL (ref 150–400)
RBC: 4.84 MIL/uL (ref 4.22–5.81)
RDW: 13.7 % (ref 11.5–15.5)
WBC: 5 10*3/uL (ref 4.0–10.5)
nRBC: 0 % (ref 0.0–0.2)

## 2022-07-21 NOTE — Progress Notes (Addendum)
Primary Care Physician: Wenda Low, MD Primary Cardiologist: none Primary Electrophysiologist: none Referring Physician: Dr Virgel Bouquet is a 76 y.o. male with a history of HTN, anxiety, atrial fibrillation who presents for follow up in the [facility].  The patient was initially diagnosed with atrial fibrillation 06/03/22 after presenting to the ED with symptoms of palpitations and lightheadedness. 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 ECG showed rapid afib and he was started on diltiazem drip which eventually converted him to SR after several hours. Patient was started on Eliquis for a CHADS2VASC score of 3 and metoprolol for rate control. Echo showed EF 70-75%, no valvular issues. In hindsight, patient may have had brief episodes of afib prior to this that he attributed to anxiety.   On follow up today, patient reports that he has done well since his last visit. He elected to stay on the low dose BB instead of PRN. His lightheadedness has resolved. Denies any episodes of tachypalpitations. He does have some indigestion at times which resolved with antacids.   Today, he denies symptoms of palpitations, chest pain, shortness of breath, orthopnea, PND, lower extremity edema, presyncope, syncope, snoring, daytime somnolence, bleeding, or neurologic sequela. The patient is tolerating medications without difficulties and is otherwise without complaint today.    Atrial Fibrillation Risk Factors:  he does not have symptoms or diagnosis of sleep apnea. he does not have a history of rheumatic fever.   he has a BMI of Body mass index is 24.34 kg/m.Marland Kitchen Filed Weights   07/21/22 1319  Weight: 74.8 kg    No family history on file.   Atrial Fibrillation Management history:  Previous antiarrhythmic drugs: none Previous cardioversions: none Previous ablations: none CHADS2VASC score: 3 Anticoagulation history:  Eliquis   Past Medical History:  Diagnosis Date   Anxiety    No past surgical history on file.  Current Outpatient Medications  Medication Sig Dispense Refill   apixaban (ELIQUIS) 5 MG TABS tablet Take 1 tablet (5 mg total) by mouth 2 (two) times daily. 60 tablet 3   diazepam (VALIUM) 5 MG tablet Take 5 mg by mouth daily as needed for anxiety.     fluocinonide (LIDEX) 0.05 % external solution APPLY 1 ML ON THE SKIN NIGHTLY 1-2 WEEKS AS NEEDED FLARES, TAPER AS ABLE     fluticasone (CUTIVATE) 0.05 % cream APPLY 1 APPLICATION TOPICAL ONCE A DAY     ketoconazole (NIZORAL) 2 % cream APPLY 1 APPLICATION ON SKIN TWICE A DAY TO FACE AS MAINTENANCE (CAN APPLY MIXED WITH HYDROCORTISONE)     meloxicam (MOBIC) 15 MG tablet Take 15 mg by mouth daily as needed for pain.     metoprolol tartrate (LOPRESSOR) 25 MG tablet Take 0.5 tablets (12.5 mg total) by mouth 2 (two) times daily. 30 tablet 3   Multiple Vitamin (MULTIVITAMIN) tablet Take 1 tablet by mouth daily.     tamsulosin (FLOMAX) 0.4 MG CAPS capsule Take 0.4 mg by mouth daily.     No current facility-administered medications for this encounter.    No Known Allergies  Social History   Socioeconomic History   Marital status: Married    Spouse name: Not on file   Number of children: Not on file   Years of education: Not on file   Highest education level: Not on file  Occupational History   Not on file  Tobacco Use   Smoking status: Never  Smokeless tobacco: Never   Tobacco comments:    Never smoke 06/09/22  Substance and Sexual Activity   Alcohol use: Yes    Alcohol/week: 5.0 standard drinks of alcohol    Types: 5 Standard drinks or equivalent per week    Comment: 1 nightly drink 06/09/22   Drug use: Never   Sexual activity: Not on file  Other Topics Concern   Not on file  Social History Narrative   Not on file   Social Determinants of Health   Financial Resource Strain: Not on file  Food Insecurity: Not on file   Transportation Needs: Not on file  Physical Activity: Not on file  Stress: Not on file  Social Connections: Not on file  Intimate Partner Violence: Not on file     ROS- All systems are reviewed and negative except as per the HPI above.  Physical Exam: Vitals:   07/21/22 1319  BP: 136/78  Pulse: (!) 48  Weight: 74.8 kg  Height: '5\' 9"'$  (1.753 m)    GEN- The patient is a well appearing elderly male, alert and oriented x 3 today.   HEENT-head normocephalic, atraumatic, sclera clear, conjunctiva pink, hearing intact, trachea midline. Lungs- Clear to ausculation bilaterally, normal work of breathing Heart- Regular rate and rhythm, bradycardia, no murmurs, rubs or gallops  GI- soft, NT, ND, + BS Extremities- no clubbing, cyanosis, or edema MS- no significant deformity or atrophy Skin- no rash or lesion Psych- euthymic mood, full affect Neuro- strength and sensation are intact   Wt Readings from Last 3 Encounters:  07/21/22 74.8 kg  06/09/22 75.1 kg  06/03/22 74.8 kg    EKG today demonstrates  SB Vent. rate 48 BPM PR interval 166 ms QRS duration 102 ms QT/QTcB 428/382 ms  Echo 06/04/22 demonstrated   1. Left ventricular ejection fraction, by estimation, is 70 to 75%. The  left ventricle has hyperdynamic function. The left ventricle has no  regional wall motion abnormalities. Left ventricular diastolic parameters  are consistent with Grade I diastolic dysfunction (impaired relaxation).   2. Right ventricular systolic function is normal. The right ventricular  size is normal.   3. The mitral valve is normal in structure. No evidence of mitral valve  regurgitation. No evidence of mitral stenosis.   4. The aortic valve is tricuspid. Aortic valve regurgitation is not  visualized. No aortic stenosis is present.   5. The inferior vena cava is normal in size with greater than 50%  respiratory variability, suggesting right atrial pressure of 3 mmHg.   Comparison(s): No prior  Echocardiogram.  Epic records are reviewed at length today  CHA2DS2-VASc Score = 3  The patient's score is based upon: CHF History: 0 HTN History: 1 Diabetes History: 0 Stroke History: 0 Vascular Disease History: 0 Age Score: 2 Gender Score: 0       ASSESSMENT AND PLAN: 1. Paroxysmal Atrial Fibrillation (ICD10:  I48.0) The patient's CHA2DS2-VASc score is 3, indicating a 3.2% annual risk of stroke.   Patient appears to be maintaining SR.  Continue Eliquis 5 mg BID. Check cbc today. Continue Lopressor 12.5 mg BID. Offered to change to PRN with bradycardia, he would like to continue present therapy for now as he feels well.  2. Secondary Hypercoagulable State (ICD10:  D68.69) The patient is at significant risk for stroke/thromboembolism based upon his CHA2DS2-VASc Score of 3.  Continue Apixaban (Eliquis).   3. HTN Stable, no changes today.   Follow up in the AF clinic in 3 months.  [facility] [address] [place], [place] [address] [phone] 07/21/2022 1:44 PM

## 2022-08-05 DIAGNOSIS — S8001XA Contusion of right knee, initial encounter: Secondary | ICD-10-CM | POA: Diagnosis not present

## 2022-08-05 DIAGNOSIS — M170 Bilateral primary osteoarthritis of knee: Secondary | ICD-10-CM | POA: Diagnosis not present

## 2022-08-12 DIAGNOSIS — M9905 Segmental and somatic dysfunction of pelvic region: Secondary | ICD-10-CM | POA: Diagnosis not present

## 2022-08-12 DIAGNOSIS — M5136 Other intervertebral disc degeneration, lumbar region: Secondary | ICD-10-CM | POA: Diagnosis not present

## 2022-08-12 DIAGNOSIS — M9903 Segmental and somatic dysfunction of lumbar region: Secondary | ICD-10-CM | POA: Diagnosis not present

## 2022-08-12 DIAGNOSIS — M9904 Segmental and somatic dysfunction of sacral region: Secondary | ICD-10-CM | POA: Diagnosis not present

## 2022-08-21 ENCOUNTER — Ambulatory Visit (HOSPITAL_COMMUNITY)
Admission: RE | Admit: 2022-08-21 | Discharge: 2022-08-21 | Disposition: A | Discharge status: Home / Self Care | Payer: Medicare Other | Source: Ambulatory Visit | Attending: Internal Medicine | Admitting: Internal Medicine

## 2022-08-21 ENCOUNTER — Encounter (HOSPITAL_COMMUNITY): Payer: Self-pay

## 2022-08-21 VITALS — BP 170/81 | HR 58 | Temp 97.9°F | Resp 18

## 2022-08-21 DIAGNOSIS — M62830 Muscle spasm of back: Secondary | ICD-10-CM

## 2022-08-21 HISTORY — DX: Benign prostatic hyperplasia without lower urinary tract symptoms: N40.0

## 2022-08-21 HISTORY — DX: Unspecified atrial fibrillation: I48.91

## 2022-08-21 HISTORY — DX: Essential (primary) hypertension: I10

## 2022-08-21 MED ORDER — TIZANIDINE HCL 4 MG PO TABS: 4.0000 mg | ORAL_TABLET | Freq: Four times a day (QID) | ORAL | 0 refills | Status: DC | PRN

## 2022-08-21 NOTE — ED Triage Notes (Signed)
Pt states for the last 2 days he has had shooting left lower back pain. He states that pain is worse when he is lying down on it. He hasn't taken any meds.

## 2022-08-21 NOTE — ED Provider Notes (Signed)
[place]    CSN: [id number] Arrival date & time: 08/21/22  1451      History   Chief Complaint Chief Complaint  Patient presents with   Back Pain    Shooting pain lower right side - Entered by patient    HPI Juan Butler is a 76 y.o. male.   Patient presents to urgent care for evaluation of right-sided low back pain that started a couple of nights ago when he was laying flat.  He states that the pain shoots from the right low back to the right side but does not go into the right lower abdomen.  Pain only happens at nighttime when he is laying flat and he states that he sleeps on his back making it very difficult for him to sleep overnight due to the pain.  Pain does not radiate down the back of the lower extremities or up the spinal column.  No discomfort to the middle of the spine, recent falls or injuries, past surgeries to the back, or recent falls.  He is not currently experiencing any discomfort and has not taken any over-the-counter medications prior to arrival urgent care for symptoms.  He has had to sleep on his side due to the low back pain for the last couple of nights.  Denies saddle anesthesia symptoms, urinary symptoms, hematuria, constipation, abdominal pain, and numbness or tingling to the bilateral lower extremities.   Back Pain   Past Medical History:  Diagnosis Date   A-fib [facility])    Anxiety    Enlarged prostate    Hypertension     Patient Active Problem List   Diagnosis Date Noted   Secondary hypercoagulable state ([place]) 06/09/2022   Anxiety 06/03/2022   New onset a-fib ([place]) 06/03/2022   A-fib ([place]) 06/03/2022    History reviewed. No pertinent surgical history.     Home Medications    Prior to Admission medications   Medication Sig Start Date End Date Taking? Authorizing Provider  apixaban (ELIQUIS) 5 MG TABS tablet Take 1 tablet (5 mg total) by mouth 2 (two) times daily. 06/09/22 08/21/22 Yes Fenton, Clint R, PA  diazepam (VALIUM)  5 MG tablet Take 5 mg by mouth daily as needed for anxiety. 05/16/22  Yes [provider]  fluocinonide (LIDEX) 0.05 % external solution APPLY 1 ML ON THE SKIN NIGHTLY 1-2 WEEKS AS NEEDED FLARES, TAPER AS ABLE   Yes [provider]  fluticasone (CUTIVATE) 0.05 % cream APPLY 1 APPLICATION TOPICAL ONCE A DAY   Yes [provider]  ketoconazole (NIZORAL) 2 % cream APPLY 1 APPLICATION ON SKIN TWICE A DAY TO FACE AS MAINTENANCE (CAN APPLY MIXED WITH HYDROCORTISONE)   Yes [provider]  metoprolol tartrate (LOPRESSOR) 25 MG tablet Take 0.5 tablets (12.5 mg total) by mouth 2 (two) times daily. 06/15/22  Yes Fenton, Clint R, PA  Multiple Vitamin (MULTIVITAMIN) tablet Take 1 tablet by mouth daily.   Yes [provider]  tamsulosin (FLOMAX) 0.4 MG CAPS capsule Take 0.4 mg by mouth daily.   Yes [provider]  tiZANidine (ZANAFLEX) 4 MG tablet Take 1 tablet (4 mg total) by mouth every 6 (six) hours as needed for muscle spasms. 08/21/22  Yes Talbot Grumbling, FNP  meloxicam (MOBIC) 15 MG tablet Take 15 mg by mouth daily as needed for pain. 05/07/22   [provider]    Family History History reviewed. No pertinent family history.  Social History Social History   Tobacco  Use   Smoking status: Never   Smokeless tobacco: Never   Tobacco comments:    Never smoke 06/09/22  Vaping Use   Vaping Use: Never used  Substance Use Topics   Alcohol use: Yes    Alcohol/week: 5.0 standard drinks of alcohol    Types: 5 Standard drinks or equivalent per week    Comment: 1 nightly drink 06/09/22   Drug use: Never     Allergies   Patient has no known allergies.   Review of Systems Review of Systems  Musculoskeletal:  Positive for back pain.  Per HPI   Physical Exam Triage Vital Signs ED Triage Vitals  Enc Vitals Group     BP 08/21/22 1535 (!) 170/81     Pulse Rate 08/21/22 1535 (!) 58     Resp 08/21/22 1535 18     Temp 08/21/22  1535 97.9 F (36.6 C)     Temp Source 08/21/22 1535 Oral     SpO2 08/21/22 1535 98 %     Weight --      Height --      Head Circumference --      Peak Flow --      Pain Score 08/21/22 1533 0     Pain Loc --      Pain Edu? --      Excl. in [place]? --    No data found.  Updated Vital Signs BP (!) 170/81 (BP Location: Right Arm)   Pulse (!) 58   Temp 97.9 F (36.6 C) (Oral)   Resp 18   SpO2 98%   Visual Acuity Right Eye Distance:   Left Eye Distance:   Bilateral Distance:    Right Eye Near:   Left Eye Near:    Bilateral Near:     Physical Exam Vitals and nursing note reviewed.  Constitutional:      Appearance: He is not ill-appearing or toxic-appearing.  HENT:     Head: Normocephalic and atraumatic.     Right Ear: Hearing and external ear normal.     Left Ear: Hearing and external ear normal.     Nose: Nose normal.     Mouth/Throat:     Lips: Pink.     Mouth: Mucous membranes are moist.     Pharynx: No posterior oropharyngeal erythema.  Eyes:     General: Lids are normal. Vision grossly intact. Gaze aligned appropriately.     Extraocular Movements: Extraocular movements intact.     Conjunctiva/sclera: Conjunctivae normal.  Cardiovascular:     Heart sounds: S1 normal and S2 normal.  Pulmonary:     Effort: Pulmonary effort is normal. No respiratory distress.     Breath sounds: Normal breath sounds and air entry.  Musculoskeletal:        General: Normal range of motion.     Cervical back: Normal and neck supple.     Thoracic back: Normal.     Lumbar back: Normal.     Comments: No tenderness to palpation of the bilateral lumbar paraspinal muscles.  Range of motion to the low back is normal.  Patient walks with a normal gait.  Strength is intact to bilateral upper and lower extremities.  No bony tenderness to the cervical, thoracic, or lumbar spine.  Skin:    General: Skin is warm and dry.     Capillary Refill: Capillary refill takes less than 2 seconds.      Findings: No rash.  Neurological:     General:  No focal deficit present.     Mental Status: He is alert and oriented to person, place, and time. Mental status is at baseline.     Cranial Nerves: No dysarthria or facial asymmetry.  Psychiatric:        Mood and Affect: Mood normal.        Speech: Speech normal.        Behavior: Behavior normal.        Thought Content: Thought content normal.        Judgment: Judgment normal.      UC Treatments / Results  Labs (all labs ordered are listed, but only abnormal results are displayed) Labs Reviewed - No data to display  EKG   Radiology No results found.  Procedures Procedures (including critical care time)  Medications Ordered in UC Medications - No data to display  Initial Impression / Assessment and Plan / UC Course  I have reviewed the triage vital signs and the nursing notes.  Pertinent labs & imaging results that were available during my care of the patient were reviewed by me and considered in my medical decision making (see chart for details).   1.  Spasm of lower back Presentation is consistent with acute muscle spasm of the lower back.  Deferred imaging based on stable musculoskeletal exam findings and hemodynamically stable vital signs in clinic.  Patient is nontoxic in appearance and is currently without pain.  He may trial taking Tylenol 1000 mg and 1 dose of tizanidine 4 mg at bedtime tonight to see if this helps with the muscle spasm which is likely related to positioning as this only happens when he is on his back in the bed.  Advised patient to return to urgent care or PCP if symptoms fail to improve with the above interventions in the next 3 to 4 days.  He may also apply heat to the lower back to relieve muscle spasm symptoms.  He is agreeable with this plan.   Discussed physical exam and available lab work findings in clinic with patient.  Counseled patient regarding appropriate use of medications and potential side  effects for all medications recommended or prescribed today. Discussed red flag signs and symptoms of worsening condition,when to call the PCP office, return to urgent care, and when to seek higher level of care in the emergency department. Patient verbalizes understanding and agreement with plan. All questions answered. Patient discharged in stable condition.    Final Clinical Impressions(s) / UC Diagnoses   Final diagnoses:  Spasm of muscle of lower back     Discharge Instructions      You may use Zanaflex muscle relaxer and Tylenol at bedtime to see if this helps with your muscle spasm to your lower back.  Apply heat 20 minutes on and 20 minutes off to the lower back to relax muscle.  If your symptoms fail to improve in the next 2 to 3 days, please return to urgent care for reevaluation.  If you suddenly experience new or worsening symptoms that are severe, please go to the nearest emergency department for reevaluation.  I hope you feel better!    ED Prescriptions     Medication Sig Dispense Auth. Provider   tiZANidine (ZANAFLEX) 4 MG tablet Take 1 tablet (4 mg total) by mouth every 6 (six) hours as needed for muscle spasms. 30 tablet Talbot Grumbling, FNP      PDMP not reviewed this encounter.   Talbot Grumbling, [place] 08/21/22 [phone]

## 2022-08-21 NOTE — Discharge Instructions (Signed)
You may use Zanaflex muscle relaxer and Tylenol at bedtime to see if this helps with your muscle spasm to your lower back.  Apply heat 20 minutes on and 20 minutes off to the lower back to relax muscle.  If your symptoms fail to improve in the next 2 to 3 days, please return to urgent care for reevaluation.  If you suddenly experience new or worsening symptoms that are severe, please go to the nearest emergency department for reevaluation.  I hope you feel better!

## 2022-09-01 DIAGNOSIS — N182 Chronic kidney disease, stage 2 (mild): Secondary | ICD-10-CM | POA: Diagnosis not present

## 2022-09-01 DIAGNOSIS — M179 Osteoarthritis of knee, unspecified: Secondary | ICD-10-CM | POA: Diagnosis not present

## 2022-09-01 DIAGNOSIS — I480 Paroxysmal atrial fibrillation: Secondary | ICD-10-CM | POA: Diagnosis not present

## 2022-09-01 DIAGNOSIS — D6869 Other thrombophilia: Secondary | ICD-10-CM | POA: Diagnosis not present

## 2022-09-01 DIAGNOSIS — I10 Essential (primary) hypertension: Secondary | ICD-10-CM | POA: Diagnosis not present

## 2022-09-01 DIAGNOSIS — R7303 Prediabetes: Secondary | ICD-10-CM | POA: Diagnosis not present

## 2022-09-01 DIAGNOSIS — L409 Psoriasis, unspecified: Secondary | ICD-10-CM | POA: Diagnosis not present

## 2022-09-01 DIAGNOSIS — Z0000 Encounter for general adult medical examination without abnormal findings: Secondary | ICD-10-CM | POA: Diagnosis not present

## 2022-09-03 DIAGNOSIS — M5136 Other intervertebral disc degeneration, lumbar region: Secondary | ICD-10-CM | POA: Diagnosis not present

## 2022-09-03 DIAGNOSIS — M9905 Segmental and somatic dysfunction of pelvic region: Secondary | ICD-10-CM | POA: Diagnosis not present

## 2022-09-03 DIAGNOSIS — M9904 Segmental and somatic dysfunction of sacral region: Secondary | ICD-10-CM | POA: Diagnosis not present

## 2022-09-03 DIAGNOSIS — M9903 Segmental and somatic dysfunction of lumbar region: Secondary | ICD-10-CM | POA: Diagnosis not present

## 2022-09-16 DIAGNOSIS — M5136 Other intervertebral disc degeneration, lumbar region: Secondary | ICD-10-CM | POA: Diagnosis not present

## 2022-09-16 DIAGNOSIS — M9904 Segmental and somatic dysfunction of sacral region: Secondary | ICD-10-CM | POA: Diagnosis not present

## 2022-09-16 DIAGNOSIS — M9905 Segmental and somatic dysfunction of pelvic region: Secondary | ICD-10-CM | POA: Diagnosis not present

## 2022-09-16 DIAGNOSIS — M9903 Segmental and somatic dysfunction of lumbar region: Secondary | ICD-10-CM | POA: Diagnosis not present

## 2022-09-23 ENCOUNTER — Other Ambulatory Visit (HOSPITAL_COMMUNITY): Payer: Self-pay | Admitting: Physician Assistant

## 2022-09-23 DIAGNOSIS — M9904 Segmental and somatic dysfunction of sacral region: Secondary | ICD-10-CM | POA: Diagnosis not present

## 2022-09-23 DIAGNOSIS — M5136 Other intervertebral disc degeneration, lumbar region: Secondary | ICD-10-CM | POA: Diagnosis not present

## 2022-09-23 DIAGNOSIS — M9903 Segmental and somatic dysfunction of lumbar region: Secondary | ICD-10-CM | POA: Diagnosis not present

## 2022-09-23 DIAGNOSIS — M9905 Segmental and somatic dysfunction of pelvic region: Secondary | ICD-10-CM | POA: Diagnosis not present

## 2022-10-06 DIAGNOSIS — M170 Bilateral primary osteoarthritis of knee: Secondary | ICD-10-CM | POA: Diagnosis not present

## 2022-10-06 DIAGNOSIS — S8001XA Contusion of right knee, initial encounter: Secondary | ICD-10-CM | POA: Insufficient documentation

## 2022-10-07 DIAGNOSIS — M5136 Other intervertebral disc degeneration, lumbar region: Secondary | ICD-10-CM | POA: Diagnosis not present

## 2022-10-07 DIAGNOSIS — M9904 Segmental and somatic dysfunction of sacral region: Secondary | ICD-10-CM | POA: Diagnosis not present

## 2022-10-07 DIAGNOSIS — M9903 Segmental and somatic dysfunction of lumbar region: Secondary | ICD-10-CM | POA: Diagnosis not present

## 2022-10-07 DIAGNOSIS — M9905 Segmental and somatic dysfunction of pelvic region: Secondary | ICD-10-CM | POA: Diagnosis not present

## 2022-10-15 DIAGNOSIS — M25561 Pain in right knee: Secondary | ICD-10-CM | POA: Diagnosis not present

## 2022-10-15 DIAGNOSIS — M25562 Pain in left knee: Secondary | ICD-10-CM | POA: Diagnosis not present

## 2022-10-20 ENCOUNTER — Encounter (HOSPITAL_COMMUNITY): Payer: Self-pay | Admitting: Physician Assistant

## 2022-10-20 ENCOUNTER — Ambulatory Visit (HOSPITAL_COMMUNITY)
Admission: RE | Admit: 2022-10-20 | Discharge: 2022-10-20 | Disposition: A | Discharge status: Home / Self Care | Payer: Medicare Other | Source: Ambulatory Visit | Attending: Physician Assistant | Admitting: Physician Assistant

## 2022-10-20 VITALS — BP 140/80 | HR 67 | Ht 69.0 in | Wt 169.4 lb

## 2022-10-20 DIAGNOSIS — Z7901 Long term (current) use of anticoagulants: Secondary | ICD-10-CM | POA: Diagnosis not present

## 2022-10-20 DIAGNOSIS — I480 Paroxysmal atrial fibrillation: Secondary | ICD-10-CM | POA: Insufficient documentation

## 2022-10-20 DIAGNOSIS — I10 Essential (primary) hypertension: Secondary | ICD-10-CM | POA: Diagnosis not present

## 2022-10-20 DIAGNOSIS — Z79899 Other long term (current) drug therapy: Secondary | ICD-10-CM | POA: Insufficient documentation

## 2022-10-20 DIAGNOSIS — D6869 Other thrombophilia: Secondary | ICD-10-CM | POA: Insufficient documentation

## 2022-10-20 DIAGNOSIS — F419 Anxiety disorder, unspecified: Secondary | ICD-10-CM | POA: Insufficient documentation

## 2022-10-20 NOTE — Progress Notes (Addendum)
Primary Care Physician: Wenda Low, MD Primary Cardiologist: none Primary Electrophysiologist: none Referring Physician: Dr Virgel Bouquet is a 77 y.o. male with a history of HTN, anxiety, atrial fibrillation who presents for follow up in the [facility].  The patient was initially diagnosed with atrial fibrillation 06/03/22 after presenting to the ED with symptoms of palpitations and lightheadedness. 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 ECG showed rapid afib and he was started on diltiazem drip which eventually converted him to SR after several hours. Patient was started on Eliquis for a CHADS2VASC score of 3 and metoprolol for rate control. Echo showed EF 70-75%, no valvular issues. In hindsight, patient may have had brief episodes of afib prior to this that he attributed to anxiety.   On follow up today, patient reports that he has done very well since his last visit. He has not had any palpitations. He has an Visual merchandiser and a Kardia mobile which have both shown only SR. No bleeding issues on anticoagulation.   Today, he denies symptoms of palpitations, chest pain, shortness of breath, orthopnea, PND, lower extremity edema, presyncope, syncope, snoring, daytime somnolence, bleeding, or neurologic sequela. The patient is tolerating medications without difficulties and is otherwise without complaint today.    Atrial Fibrillation Risk Factors:  he does not have symptoms or diagnosis of sleep apnea. he does not have a history of rheumatic fever.   he has a BMI of Body mass index is 25.02 kg/m.Marland Kitchen Filed Weights   10/20/22 1323  Weight: 76.8 kg    No family history on file.   Atrial Fibrillation Management history:  Previous antiarrhythmic drugs: none Previous cardioversions: none Previous ablations: none CHADS2VASC score: 3 Anticoagulation history: Eliquis   Past Medical History:  Diagnosis Date    A-fib ([place])    Anxiety    Enlarged prostate    Hypertension    No past surgical history on file.  Current Outpatient Medications  Medication Sig Dispense Refill   apixaban (ELIQUIS) 5 MG TABS tablet Take 1 tablet (5 mg total) by mouth 2 (two) times daily. 60 tablet 3   diazepam (VALIUM) 5 MG tablet Take 5 mg by mouth daily as needed for anxiety.     fluocinonide (LIDEX) 0.05 % external solution APPLY 1 ML ON THE SKIN NIGHTLY 1-2 WEEKS AS NEEDED FLARES, TAPER AS ABLE     fluticasone (CUTIVATE) 0.05 % cream APPLY 1 APPLICATION TOPICAL ONCE A DAY     ketoconazole (NIZORAL) 2 % cream APPLY 1 APPLICATION ON SKIN TWICE A DAY TO FACE AS MAINTENANCE (CAN APPLY MIXED WITH HYDROCORTISONE)     meloxicam (MOBIC) 15 MG tablet Take 15 mg by mouth daily as needed for pain.     metoprolol tartrate (LOPRESSOR) 25 MG tablet TAKE 0.5 TABLETS BY MOUTH 2 TIMES DAILY. 90 tablet 1   Multiple Vitamin (MULTIVITAMIN) tablet Take 1 tablet by mouth daily.     tamsulosin (FLOMAX) 0.4 MG CAPS capsule Take 0.4 mg by mouth daily.     tiZANidine (ZANAFLEX) 4 MG tablet Take 1 tablet (4 mg total) by mouth every 6 (six) hours as needed for muscle spasms. 30 tablet 0   No current facility-administered medications for this encounter.    No Known Allergies  Social History   Socioeconomic History   Marital status: Married    Spouse name: Not on file   Number of children: Not on file  Years of education: Not on file   Highest education level: Not on file  Occupational History   Not on file  Tobacco Use   Smoking status: Never   Smokeless tobacco: Never   Tobacco comments:    Never smoke 06/09/22  Vaping Use   Vaping Use: Never used  Substance and Sexual Activity   Alcohol use: Yes    Alcohol/week: 5.0 standard drinks of alcohol    Types: 5 Standard drinks or equivalent per week    Comment: 1 nightly drink 06/09/22   Drug use: Never   Sexual activity: Not on file  Other Topics Concern   Not on file  Social  History Narrative   Not on file   Social Determinants of Health   Financial Resource Strain: Not on file  Food Insecurity: Not on file  Transportation Needs: Not on file  Physical Activity: Not on file  Stress: Not on file  Social Connections: Not on file  Intimate Partner Violence: Not on file     ROS- All systems are reviewed and negative except as per the HPI above.  Physical Exam: Vitals:   10/20/22 1323  BP: (!) 140/80  Pulse: 67  Weight: 76.8 kg  Height: '5\' 9"'$  (1.753 m)     GEN- The patient is a well appearing elderly male, alert and oriented x 3 today.   HEENT-head normocephalic, atraumatic, sclera clear, conjunctiva pink, hearing intact, trachea midline. Lungs- Clear to ausculation bilaterally, normal work of breathing Heart- Regular rate and rhythm, no murmurs, rubs or gallops  GI- soft, NT, ND, + BS Extremities- no clubbing, cyanosis, or edema MS- no significant deformity or atrophy Skin- no rash or lesion Psych- euthymic mood, full affect Neuro- strength and sensation are intact   Wt Readings from Last 3 Encounters:  10/20/22 76.8 kg  07/21/22 74.8 kg  06/09/22 75.1 kg    EKG today demonstrates  SR Vent. rate 67 BPM PR interval 168 ms QRS duration 98 ms QT/QTcB 394/416 ms  Echo 06/04/22 demonstrated   1. Left ventricular ejection fraction, by estimation, is 70 to 75%. The  left ventricle has hyperdynamic function. The left ventricle has no  regional wall motion abnormalities. Left ventricular diastolic parameters  are consistent with Grade I diastolic dysfunction (impaired relaxation).   2. Right ventricular systolic function is normal. The right ventricular  size is normal.   3. The mitral valve is normal in structure. No evidence of mitral valve  regurgitation. No evidence of mitral stenosis.   4. The aortic valve is tricuspid. Aortic valve regurgitation is not  visualized. No aortic stenosis is present.   5. The inferior vena cava is normal  in size with greater than 50%  respiratory variability, suggesting right atrial pressure of 3 mmHg.   Comparison(s): No prior Echocardiogram.  Epic records are reviewed at length today  CHA2DS2-VASc Score = 3  The patient's score is based upon: CHF History: 0 HTN History: 1 Diabetes History: 0 Stroke History: 0 Vascular Disease History: 0 Age Score: 2 Gender Score: 0       ASSESSMENT AND PLAN: 1. Paroxysmal Atrial Fibrillation (ICD10:  I48.0) The patient's CHA2DS2-VASc score is 3, indicating a 3.2% annual risk of stroke.   Patient appears to be maintaining SR. Continue Eliquis 5 mg BID Continue Lopressor 12.5 mg BID Apple Watch and Kardia mobile for home monitoring.   2. Secondary Hypercoagulable State (ICD10:  D68.69) The patient is at significant risk for stroke/thromboembolism based upon his CHA2DS2-VASc  Score of 3.  Continue Apixaban (Eliquis).   3. HTN Stable, no changes today.   Will refer to establish care with a primary cardiologist. AF clinic in one year.   [facility] [address] [place],  [address] [phone] 10/20/2022 1:31 PM

## 2022-10-21 DIAGNOSIS — M5136 Other intervertebral disc degeneration, lumbar region: Secondary | ICD-10-CM | POA: Diagnosis not present

## 2022-10-21 DIAGNOSIS — M9904 Segmental and somatic dysfunction of sacral region: Secondary | ICD-10-CM | POA: Diagnosis not present

## 2022-10-21 DIAGNOSIS — M9905 Segmental and somatic dysfunction of pelvic region: Secondary | ICD-10-CM | POA: Diagnosis not present

## 2022-10-21 DIAGNOSIS — M9903 Segmental and somatic dysfunction of lumbar region: Secondary | ICD-10-CM | POA: Diagnosis not present

## 2022-10-28 ENCOUNTER — Other Ambulatory Visit (HOSPITAL_COMMUNITY): Payer: Self-pay | Admitting: Physician Assistant

## 2022-11-04 DIAGNOSIS — M9905 Segmental and somatic dysfunction of pelvic region: Secondary | ICD-10-CM | POA: Diagnosis not present

## 2022-11-04 DIAGNOSIS — M9903 Segmental and somatic dysfunction of lumbar region: Secondary | ICD-10-CM | POA: Diagnosis not present

## 2022-11-04 DIAGNOSIS — M5136 Other intervertebral disc degeneration, lumbar region: Secondary | ICD-10-CM | POA: Diagnosis not present

## 2022-11-04 DIAGNOSIS — M9904 Segmental and somatic dysfunction of sacral region: Secondary | ICD-10-CM | POA: Diagnosis not present

## 2022-11-18 DIAGNOSIS — M5136 Other intervertebral disc degeneration, lumbar region: Secondary | ICD-10-CM | POA: Diagnosis not present

## 2022-11-18 DIAGNOSIS — M9903 Segmental and somatic dysfunction of lumbar region: Secondary | ICD-10-CM | POA: Diagnosis not present

## 2022-11-18 DIAGNOSIS — M9905 Segmental and somatic dysfunction of pelvic region: Secondary | ICD-10-CM | POA: Diagnosis not present

## 2022-11-18 DIAGNOSIS — M9904 Segmental and somatic dysfunction of sacral region: Secondary | ICD-10-CM | POA: Diagnosis not present

## 2022-12-02 DIAGNOSIS — M9903 Segmental and somatic dysfunction of lumbar region: Secondary | ICD-10-CM | POA: Diagnosis not present

## 2022-12-02 DIAGNOSIS — M5136 Other intervertebral disc degeneration, lumbar region: Secondary | ICD-10-CM | POA: Diagnosis not present

## 2022-12-02 DIAGNOSIS — M9905 Segmental and somatic dysfunction of pelvic region: Secondary | ICD-10-CM | POA: Diagnosis not present

## 2022-12-02 DIAGNOSIS — M9904 Segmental and somatic dysfunction of sacral region: Secondary | ICD-10-CM | POA: Diagnosis not present

## 2022-12-07 DIAGNOSIS — H0102B Squamous blepharitis left eye, upper and lower eyelids: Secondary | ICD-10-CM | POA: Diagnosis not present

## 2022-12-07 DIAGNOSIS — H16041 Marginal corneal ulcer, right eye: Secondary | ICD-10-CM | POA: Diagnosis not present

## 2022-12-07 DIAGNOSIS — H0102A Squamous blepharitis right eye, upper and lower eyelids: Secondary | ICD-10-CM | POA: Diagnosis not present

## 2022-12-09 DIAGNOSIS — H0102B Squamous blepharitis left eye, upper and lower eyelids: Secondary | ICD-10-CM | POA: Diagnosis not present

## 2022-12-09 DIAGNOSIS — H0102A Squamous blepharitis right eye, upper and lower eyelids: Secondary | ICD-10-CM | POA: Diagnosis not present

## 2022-12-09 DIAGNOSIS — H16041 Marginal corneal ulcer, right eye: Secondary | ICD-10-CM | POA: Diagnosis not present

## 2022-12-13 ENCOUNTER — Telehealth: Payer: Self-pay | Admitting: Internal Medicine

## 2022-12-13 NOTE — Telephone Encounter (Signed)
Received call at 6:43 AM from Karie Fetch regarding recurrent atrial fibrillation.   Patient states that he began to experience subtle palpitations this AM upon awakening. Reports that this is first recurrence of AF since diagnosis in September 2023. Denies any other accompanying symptoms. Specifically, he denies CP, SOB, exertional intolerance, lightheadedness, presyncope, falls, or diaphoresis. Denies any recent viral illnesses or sick contacts. He is unable to identify any possible triggers for his recurrent AF. He has had no missed doses of his metoprolol or apixaban.   BP during visit on home cuff was 135/78. HR 70-80 bpm.   Patient requesting earlier follow-up in AF clinic.

## 2022-12-14 DIAGNOSIS — H16041 Marginal corneal ulcer, right eye: Secondary | ICD-10-CM | POA: Diagnosis not present

## 2022-12-14 DIAGNOSIS — H0102B Squamous blepharitis left eye, upper and lower eyelids: Secondary | ICD-10-CM | POA: Diagnosis not present

## 2022-12-14 DIAGNOSIS — H0102A Squamous blepharitis right eye, upper and lower eyelids: Secondary | ICD-10-CM | POA: Diagnosis not present

## 2022-12-14 NOTE — Telephone Encounter (Signed)
Patient back in normal rhythm as of 8pm last night. Feeling back to baseline just fatigued from being in afib. Pt took 2 extra doses of metoprolol yesterday HR mainly in the 70s during the episodes.  Pt does endorse URI for the last 10 days with use of OTC cold medication. Pt will let us know if episodes of afib become more frequent if so then will bring in for follow up.

## 2022-12-16 DIAGNOSIS — M9903 Segmental and somatic dysfunction of lumbar region: Secondary | ICD-10-CM | POA: Diagnosis not present

## 2022-12-16 DIAGNOSIS — M9905 Segmental and somatic dysfunction of pelvic region: Secondary | ICD-10-CM | POA: Diagnosis not present

## 2022-12-16 DIAGNOSIS — M5136 Other intervertebral disc degeneration, lumbar region: Secondary | ICD-10-CM | POA: Diagnosis not present

## 2022-12-16 DIAGNOSIS — M9904 Segmental and somatic dysfunction of sacral region: Secondary | ICD-10-CM | POA: Diagnosis not present

## 2022-12-21 DIAGNOSIS — M170 Bilateral primary osteoarthritis of knee: Secondary | ICD-10-CM | POA: Diagnosis not present

## 2023-01-04 DIAGNOSIS — R3912 Poor urinary stream: Secondary | ICD-10-CM | POA: Diagnosis not present

## 2023-01-06 DIAGNOSIS — M9905 Segmental and somatic dysfunction of pelvic region: Secondary | ICD-10-CM | POA: Diagnosis not present

## 2023-01-06 DIAGNOSIS — M5136 Other intervertebral disc degeneration, lumbar region: Secondary | ICD-10-CM | POA: Diagnosis not present

## 2023-01-06 DIAGNOSIS — M9904 Segmental and somatic dysfunction of sacral region: Secondary | ICD-10-CM | POA: Diagnosis not present

## 2023-01-06 DIAGNOSIS — M9903 Segmental and somatic dysfunction of lumbar region: Secondary | ICD-10-CM | POA: Diagnosis not present

## 2023-01-15 ENCOUNTER — Other Ambulatory Visit (HOSPITAL_COMMUNITY): Payer: Self-pay | Admitting: Physician Assistant

## 2023-01-27 DIAGNOSIS — M9903 Segmental and somatic dysfunction of lumbar region: Secondary | ICD-10-CM | POA: Diagnosis not present

## 2023-01-27 DIAGNOSIS — M9905 Segmental and somatic dysfunction of pelvic region: Secondary | ICD-10-CM | POA: Diagnosis not present

## 2023-01-27 DIAGNOSIS — M5136 Other intervertebral disc degeneration, lumbar region: Secondary | ICD-10-CM | POA: Diagnosis not present

## 2023-01-27 DIAGNOSIS — M9904 Segmental and somatic dysfunction of sacral region: Secondary | ICD-10-CM | POA: Diagnosis not present

## 2023-02-24 DIAGNOSIS — M9903 Segmental and somatic dysfunction of lumbar region: Secondary | ICD-10-CM | POA: Diagnosis not present

## 2023-02-24 DIAGNOSIS — M9905 Segmental and somatic dysfunction of pelvic region: Secondary | ICD-10-CM | POA: Diagnosis not present

## 2023-02-24 DIAGNOSIS — M9904 Segmental and somatic dysfunction of sacral region: Secondary | ICD-10-CM | POA: Diagnosis not present

## 2023-02-24 DIAGNOSIS — M5136 Other intervertebral disc degeneration, lumbar region: Secondary | ICD-10-CM | POA: Diagnosis not present

## 2023-03-02 DIAGNOSIS — N182 Chronic kidney disease, stage 2 (mild): Secondary | ICD-10-CM | POA: Diagnosis not present

## 2023-03-02 DIAGNOSIS — D6869 Other thrombophilia: Secondary | ICD-10-CM | POA: Diagnosis not present

## 2023-03-02 DIAGNOSIS — I10 Essential (primary) hypertension: Secondary | ICD-10-CM | POA: Diagnosis not present

## 2023-03-02 DIAGNOSIS — L409 Psoriasis, unspecified: Secondary | ICD-10-CM | POA: Diagnosis not present

## 2023-03-02 DIAGNOSIS — R7303 Prediabetes: Secondary | ICD-10-CM | POA: Diagnosis not present

## 2023-03-02 DIAGNOSIS — I480 Paroxysmal atrial fibrillation: Secondary | ICD-10-CM | POA: Diagnosis not present

## 2023-03-17 DIAGNOSIS — M5136 Other intervertebral disc degeneration, lumbar region: Secondary | ICD-10-CM | POA: Diagnosis not present

## 2023-03-17 DIAGNOSIS — M9905 Segmental and somatic dysfunction of pelvic region: Secondary | ICD-10-CM | POA: Diagnosis not present

## 2023-03-17 DIAGNOSIS — M9903 Segmental and somatic dysfunction of lumbar region: Secondary | ICD-10-CM | POA: Diagnosis not present

## 2023-03-17 DIAGNOSIS — M9904 Segmental and somatic dysfunction of sacral region: Secondary | ICD-10-CM | POA: Diagnosis not present

## 2023-03-30 DIAGNOSIS — M170 Bilateral primary osteoarthritis of knee: Secondary | ICD-10-CM | POA: Diagnosis not present

## 2023-03-31 DIAGNOSIS — M9904 Segmental and somatic dysfunction of sacral region: Secondary | ICD-10-CM | POA: Diagnosis not present

## 2023-03-31 DIAGNOSIS — M9903 Segmental and somatic dysfunction of lumbar region: Secondary | ICD-10-CM | POA: Diagnosis not present

## 2023-03-31 DIAGNOSIS — M9905 Segmental and somatic dysfunction of pelvic region: Secondary | ICD-10-CM | POA: Diagnosis not present

## 2023-03-31 DIAGNOSIS — M5136 Other intervertebral disc degeneration, lumbar region: Secondary | ICD-10-CM | POA: Diagnosis not present

## 2023-04-06 DIAGNOSIS — M170 Bilateral primary osteoarthritis of knee: Secondary | ICD-10-CM | POA: Diagnosis not present

## 2023-04-13 DIAGNOSIS — M25562 Pain in left knee: Secondary | ICD-10-CM | POA: Diagnosis not present

## 2023-04-13 DIAGNOSIS — M170 Bilateral primary osteoarthritis of knee: Secondary | ICD-10-CM | POA: Diagnosis not present

## 2023-04-13 DIAGNOSIS — M25561 Pain in right knee: Secondary | ICD-10-CM | POA: Diagnosis not present

## 2023-04-14 DIAGNOSIS — M9904 Segmental and somatic dysfunction of sacral region: Secondary | ICD-10-CM | POA: Diagnosis not present

## 2023-04-14 DIAGNOSIS — M9905 Segmental and somatic dysfunction of pelvic region: Secondary | ICD-10-CM | POA: Diagnosis not present

## 2023-04-14 DIAGNOSIS — M5136 Other intervertebral disc degeneration, lumbar region: Secondary | ICD-10-CM | POA: Diagnosis not present

## 2023-04-14 DIAGNOSIS — M9903 Segmental and somatic dysfunction of lumbar region: Secondary | ICD-10-CM | POA: Diagnosis not present

## 2023-05-04 DIAGNOSIS — I480 Paroxysmal atrial fibrillation: Secondary | ICD-10-CM | POA: Diagnosis not present

## 2023-05-04 DIAGNOSIS — H35363 Drusen (degenerative) of macula, bilateral: Secondary | ICD-10-CM | POA: Diagnosis not present

## 2023-05-04 DIAGNOSIS — I10 Essential (primary) hypertension: Secondary | ICD-10-CM | POA: Diagnosis not present

## 2023-05-04 DIAGNOSIS — H353133 Nonexudative age-related macular degeneration, bilateral, advanced atrophic without subfoveal involvement: Secondary | ICD-10-CM | POA: Diagnosis not present

## 2023-05-04 DIAGNOSIS — H40013 Open angle with borderline findings, low risk, bilateral: Secondary | ICD-10-CM | POA: Diagnosis not present

## 2023-05-04 DIAGNOSIS — H524 Presbyopia: Secondary | ICD-10-CM | POA: Diagnosis not present

## 2023-05-04 DIAGNOSIS — H04123 Dry eye syndrome of bilateral lacrimal glands: Secondary | ICD-10-CM | POA: Diagnosis not present

## 2023-05-05 DIAGNOSIS — M9904 Segmental and somatic dysfunction of sacral region: Secondary | ICD-10-CM | POA: Diagnosis not present

## 2023-05-05 DIAGNOSIS — M5136 Other intervertebral disc degeneration, lumbar region: Secondary | ICD-10-CM | POA: Diagnosis not present

## 2023-05-05 DIAGNOSIS — M9905 Segmental and somatic dysfunction of pelvic region: Secondary | ICD-10-CM | POA: Diagnosis not present

## 2023-05-05 DIAGNOSIS — M9903 Segmental and somatic dysfunction of lumbar region: Secondary | ICD-10-CM | POA: Diagnosis not present

## 2023-05-06 ENCOUNTER — Encounter: Payer: Self-pay | Admitting: Cardiovascular Disease

## 2023-05-06 NOTE — Progress Notes (Signed)
Primary doctor's Cardiology Office Note:  .   Date:  05/07/2023  ID:  Nino, Strube Jan 26, 1946, MRN [id number] PCP: Georgann Housekeeper, MD  [facility] Providers Cardiologist:    Click to update primary MD,subspecialty MD or APP then REFRESH:1}   History of Present Illness: Marland Kitchen   Juan Butler is a 77 y.o. male with hx of PE, HTN,  and new onset atrial fib diagnosed in Sept. 2023  Was hospitalized was started on a Cardizem drip.  He converted back to sinus rhythm at some point.  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  No ST or T wave changes.  ECG from Sept. 6, 2023:  atrial fib with HR 167 .    Risk Assessment/Calculations:    CHA2DS2-VASc Score = 3  {This indicates a 3.2% annual risk of stroke. The patient's score is based upon: CHF History: 0 HTN History: 1 Diabetes History: 0 Stroke History: 0 Vascular Disease History: 0 Age Score: 2 Gender Score: 0        Physical Exam:   VS:  BP 124/66   Pulse 65   Ht 5\' 9"  (1.753 m)   Wt 166 lb 6.4 oz (75.5 kg)   SpO2 97%   BMI 24.57 kg/m    Wt Readings from Last 3 Encounters:  05/07/23 166 lb 6.4 oz (75.5 kg)  10/20/22 169 lb 6.4 oz (76.8 kg)  07/21/22 164 lb 12.8 oz (74.8 kg)    GEN: Well nourished, well developed in no acute distress NECK: No JVD; No carotid bruits CARDIAC: RRR, no murmurs, rubs, gallops RESPIRATORY:  Clear to auscultation without rales, wheezing or rhonchi  ABDOMEN: Soft, non-tender, non-distended EXTREMITIES:  No edema; No deformity   ASSESSMENT AND PLAN: .    PAF:    fairly well controlled.   We discussed ablation , watchman , cardioversion ,  antiarrhythmics  We discussed using propranolol instead of metoprolol  For now will continue metoprolol and eliquis   Will see in 1 year .          Dispo:   Signed, Kristeen Miss, MD

## 2023-05-07 ENCOUNTER — Encounter: Payer: Self-pay | Admitting: Cardiovascular Disease

## 2023-05-07 ENCOUNTER — Ambulatory Visit: Payer: Medicare Other | Attending: Cardiovascular Disease | Admitting: Cardiovascular Disease

## 2023-05-07 VITALS — BP 124/66 | HR 65 | Ht 69.0 in | Wt 166.4 lb

## 2023-05-07 DIAGNOSIS — I480 Paroxysmal atrial fibrillation: Secondary | ICD-10-CM | POA: Diagnosis not present

## 2023-05-07 DIAGNOSIS — I10 Essential (primary) hypertension: Secondary | ICD-10-CM | POA: Diagnosis not present

## 2023-05-07 NOTE — Patient Instructions (Signed)

## 2023-05-24 DIAGNOSIS — L218 Other seborrheic dermatitis: Secondary | ICD-10-CM | POA: Diagnosis not present

## 2023-05-24 DIAGNOSIS — L570 Actinic keratosis: Secondary | ICD-10-CM | POA: Diagnosis not present

## 2023-05-24 DIAGNOSIS — L812 Freckles: Secondary | ICD-10-CM | POA: Diagnosis not present

## 2023-05-24 DIAGNOSIS — L821 Other seborrheic keratosis: Secondary | ICD-10-CM | POA: Diagnosis not present

## 2023-05-26 DIAGNOSIS — M9904 Segmental and somatic dysfunction of sacral region: Secondary | ICD-10-CM | POA: Diagnosis not present

## 2023-05-26 DIAGNOSIS — M5136 Other intervertebral disc degeneration, lumbar region: Secondary | ICD-10-CM | POA: Diagnosis not present

## 2023-05-26 DIAGNOSIS — M9905 Segmental and somatic dysfunction of pelvic region: Secondary | ICD-10-CM | POA: Diagnosis not present

## 2023-05-26 DIAGNOSIS — M9903 Segmental and somatic dysfunction of lumbar region: Secondary | ICD-10-CM | POA: Diagnosis not present

## 2023-05-28 ENCOUNTER — Other Ambulatory Visit (HOSPITAL_COMMUNITY): Payer: Self-pay | Admitting: Physician Assistant

## 2023-05-28 ENCOUNTER — Other Ambulatory Visit: Payer: Self-pay | Admitting: Internal Medicine

## 2023-05-28 MED ORDER — APIXABAN 5 MG PO TABS: 5.0000 mg | ORAL_TABLET | Freq: Two times a day (BID) | ORAL | 6 refills | Status: DC

## 2023-05-28 NOTE — Progress Notes (Signed)
Patient without refills of eliquis. Will run out in 1-2 days. Wrote for new Rx.  Gerre Scull MD Cardiology

## 2023-06-16 DIAGNOSIS — M9905 Segmental and somatic dysfunction of pelvic region: Secondary | ICD-10-CM | POA: Diagnosis not present

## 2023-06-16 DIAGNOSIS — M5136 Other intervertebral disc degeneration, lumbar region: Secondary | ICD-10-CM | POA: Diagnosis not present

## 2023-06-16 DIAGNOSIS — M9903 Segmental and somatic dysfunction of lumbar region: Secondary | ICD-10-CM | POA: Diagnosis not present

## 2023-06-16 DIAGNOSIS — M9904 Segmental and somatic dysfunction of sacral region: Secondary | ICD-10-CM | POA: Diagnosis not present

## 2023-06-22 DIAGNOSIS — M25561 Pain in right knee: Secondary | ICD-10-CM | POA: Diagnosis not present

## 2023-06-22 DIAGNOSIS — M25562 Pain in left knee: Secondary | ICD-10-CM | POA: Diagnosis not present

## 2023-07-07 DIAGNOSIS — M9904 Segmental and somatic dysfunction of sacral region: Secondary | ICD-10-CM | POA: Diagnosis not present

## 2023-07-07 DIAGNOSIS — M9905 Segmental and somatic dysfunction of pelvic region: Secondary | ICD-10-CM | POA: Diagnosis not present

## 2023-07-07 DIAGNOSIS — M9903 Segmental and somatic dysfunction of lumbar region: Secondary | ICD-10-CM | POA: Diagnosis not present

## 2023-07-28 DIAGNOSIS — M9903 Segmental and somatic dysfunction of lumbar region: Secondary | ICD-10-CM | POA: Diagnosis not present

## 2023-07-28 DIAGNOSIS — M5136 Other intervertebral disc degeneration, lumbar region with discogenic back pain only: Secondary | ICD-10-CM | POA: Diagnosis not present

## 2023-07-28 DIAGNOSIS — M9905 Segmental and somatic dysfunction of pelvic region: Secondary | ICD-10-CM | POA: Diagnosis not present

## 2023-07-28 DIAGNOSIS — M9904 Segmental and somatic dysfunction of sacral region: Secondary | ICD-10-CM | POA: Diagnosis not present

## 2023-07-30 DIAGNOSIS — M25561 Pain in right knee: Secondary | ICD-10-CM | POA: Diagnosis not present

## 2023-07-30 DIAGNOSIS — M1712 Unilateral primary osteoarthritis, left knee: Secondary | ICD-10-CM | POA: Diagnosis not present

## 2023-07-30 DIAGNOSIS — M25562 Pain in left knee: Secondary | ICD-10-CM | POA: Diagnosis not present

## 2023-08-06 ENCOUNTER — Telehealth: Payer: Self-pay

## 2023-08-06 DIAGNOSIS — Z01818 Encounter for other preprocedural examination: Secondary | ICD-10-CM

## 2023-08-06 DIAGNOSIS — I480 Paroxysmal atrial fibrillation: Secondary | ICD-10-CM

## 2023-08-06 NOTE — Telephone Encounter (Signed)
Called and spoke to patient to schedule tele visit for clearance on Fri 10/08/23 and patient was also scheduled to come get his labs on 12/31 patient voiced understanding consent has been done.    Patient Consent for Virtual Visit         Juan Butler has provided verbal consent on 08/06/2023 for a virtual visit (video or telephone).   CONSENT FOR VIRTUAL VISIT FOR:  Juan Butler  By participating in this virtual visit I agree to the following:  I hereby voluntarily request, consent and authorize [facility] and its employed or contracted physicians, physician assistants, nurse practitioners or other licensed health care professionals (the Practitioner), to provide me with telemedicine health care services (the "Services") as deemed necessary by the treating Practitioner. I acknowledge and consent to receive the Services by the Practitioner via telemedicine. I understand that the telemedicine visit will involve communicating with the Practitioner through live audiovisual communication technology and the disclosure of certain medical information by electronic transmission. I acknowledge that I have been given the opportunity to request an in-person assessment or other available alternative prior to the telemedicine visit and am voluntarily participating in the telemedicine visit.  I understand that I have the right to withhold or withdraw my consent to the use of telemedicine in the course of my care at any time, without affecting my right to future care or treatment, and that the Practitioner or I may terminate the telemedicine visit at any time. I understand that I have the right to inspect all information obtained and/or recorded in the course of the telemedicine visit and may receive copies of available information for a reasonable fee.  I understand that some of the potential risks of receiving the Services via telemedicine include:  Delay or interruption in medical evaluation due to  technological equipment failure or disruption; Information transmitted may not be sufficient (e.g. poor resolution of images) to allow for appropriate medical decision making by the Practitioner; and/or  In rare instances, security protocols could fail, causing a breach of personal health information.  Furthermore, I acknowledge that it is my responsibility to provide information about my medical history, conditions and care that is complete and accurate to the best of my ability. I acknowledge that Practitioner's advice, recommendations, and/or decision may be based on factors not within their control, such as incomplete or inaccurate data provided by me or distortions of diagnostic images or specimens that may result from electronic transmissions. I understand that the practice of medicine is not an exact science and that Practitioner makes no warranties or guarantees regarding treatment outcomes. I acknowledge that a copy of this consent can be made available to me via my patient portal [facility] My[facility]), or I can request a printed copy by calling the office of [facility].    I understand that my insurance will be billed for this visit.   I have read or had this consent read to me. I understand the contents of this consent, which adequately explains the benefits and risks of the Services being provided via telemedicine.  I have been provided ample opportunity to ask questions regarding this consent and the Services and have had my questions answered to my satisfaction. I give my informed consent for the services to be provided through the use of telemedicine in my medical care

## 2023-08-06 NOTE — Telephone Encounter (Signed)
Callback team please contact patient to see if recent labs have been drawn by her PCP or other practice.  If labs have not been drawn patient will need updated CBC and BMET prior to recommendations being made regarding Eliquis.  Patient will need a virtual visit after labs and recommendations are complete.  Thanks, Alden Server

## 2023-08-06 NOTE — Telephone Encounter (Signed)
   Pre-operative Risk Assessment    Patient Name: Juan Butler  DOB: Feb 21, 1946 MRN: [id number]   Last ov:05/07/2023 Upcoming visit:Unknown       Request for Surgical Clearance    Procedure:   Left total knee arthroplasty   Date of Surgery:  Clearance 10/19/23                                 Surgeon:  Dr. Durene Romans  Surgeon's Group or Practice Name:  [facility]  Phone number:  [phone] Fax number:  [phone]   Type of Clearance Requested:   - Medical  - Pharmacy:  Hold Apixaban (Eliquis) Not indicated    Type of Anesthesia:  Spinal   Additional requests/questions:    Vance Peper   08/06/2023, 11:22 AM

## 2023-08-06 NOTE — Telephone Encounter (Signed)
Called and spoke to patient scheduled his tele visit on Friday 10/08/23 and patient was also scheduled for his labs on 10/31 per Robin Searing, NP patient voiced understanding.

## 2023-08-18 DIAGNOSIS — M9905 Segmental and somatic dysfunction of pelvic region: Secondary | ICD-10-CM | POA: Diagnosis not present

## 2023-08-18 DIAGNOSIS — M9904 Segmental and somatic dysfunction of sacral region: Secondary | ICD-10-CM | POA: Diagnosis not present

## 2023-08-18 DIAGNOSIS — M51361 Other intervertebral disc degeneration, lumbar region with lower extremity pain only: Secondary | ICD-10-CM | POA: Diagnosis not present

## 2023-08-18 DIAGNOSIS — M9903 Segmental and somatic dysfunction of lumbar region: Secondary | ICD-10-CM | POA: Diagnosis not present

## 2023-09-07 DIAGNOSIS — M179 Osteoarthritis of knee, unspecified: Secondary | ICD-10-CM | POA: Diagnosis not present

## 2023-09-07 DIAGNOSIS — Z0000 Encounter for general adult medical examination without abnormal findings: Secondary | ICD-10-CM | POA: Diagnosis not present

## 2023-09-07 DIAGNOSIS — R7303 Prediabetes: Secondary | ICD-10-CM | POA: Diagnosis not present

## 2023-09-07 DIAGNOSIS — I10 Essential (primary) hypertension: Secondary | ICD-10-CM | POA: Diagnosis not present

## 2023-09-07 DIAGNOSIS — L409 Psoriasis, unspecified: Secondary | ICD-10-CM | POA: Diagnosis not present

## 2023-09-07 DIAGNOSIS — I480 Paroxysmal atrial fibrillation: Secondary | ICD-10-CM | POA: Diagnosis not present

## 2023-09-07 DIAGNOSIS — D6869 Other thrombophilia: Secondary | ICD-10-CM | POA: Diagnosis not present

## 2023-09-07 DIAGNOSIS — N182 Chronic kidney disease, stage 2 (mild): Secondary | ICD-10-CM | POA: Diagnosis not present

## 2023-09-15 DIAGNOSIS — M9903 Segmental and somatic dysfunction of lumbar region: Secondary | ICD-10-CM | POA: Diagnosis not present

## 2023-09-15 DIAGNOSIS — M51361 Other intervertebral disc degeneration, lumbar region with lower extremity pain only: Secondary | ICD-10-CM | POA: Diagnosis not present

## 2023-09-15 DIAGNOSIS — M9905 Segmental and somatic dysfunction of pelvic region: Secondary | ICD-10-CM | POA: Diagnosis not present

## 2023-09-15 DIAGNOSIS — M9904 Segmental and somatic dysfunction of sacral region: Secondary | ICD-10-CM | POA: Diagnosis not present

## 2023-09-17 ENCOUNTER — Telehealth (HOSPITAL_COMMUNITY): Payer: Self-pay | Admitting: *Deleted

## 2023-09-17 MED ORDER — DILTIAZEM HCL ER COATED BEADS 120 MG PO CP24: 120.0000 mg | ORAL_CAPSULE | Freq: Every day | ORAL | 3 refills | Status: DC

## 2023-09-17 NOTE — Telephone Encounter (Signed)
Patient called in stating he is having fatigue he feels is related to metoprolol. His symptoms start shortly after taking metoprolol.  No afib episodes. Discussed with Jorja Loa PA will stop metoprolol and amlodipine and start cardizem 120mg  once a day. Pt to follow BP/HR. Will call if issues or symptoms do not improve.

## 2023-09-27 DIAGNOSIS — M9905 Segmental and somatic dysfunction of pelvic region: Secondary | ICD-10-CM | POA: Diagnosis not present

## 2023-09-27 DIAGNOSIS — M9904 Segmental and somatic dysfunction of sacral region: Secondary | ICD-10-CM | POA: Diagnosis not present

## 2023-09-27 DIAGNOSIS — M51361 Other intervertebral disc degeneration, lumbar region with lower extremity pain only: Secondary | ICD-10-CM | POA: Diagnosis not present

## 2023-09-27 DIAGNOSIS — M9903 Segmental and somatic dysfunction of lumbar region: Secondary | ICD-10-CM | POA: Diagnosis not present

## 2023-09-28 ENCOUNTER — Other Ambulatory Visit: Payer: Medicare Other

## 2023-09-28 DIAGNOSIS — M25569 Pain in unspecified knee: Secondary | ICD-10-CM | POA: Diagnosis not present

## 2023-09-28 DIAGNOSIS — Z01818 Encounter for other preprocedural examination: Secondary | ICD-10-CM | POA: Diagnosis not present

## 2023-09-28 LAB — CBC

## 2023-09-29 LAB — CBC
Hematocrit: 43.9 % (ref 37.5–51.0)
Hemoglobin: 14.7 g/dL (ref 13.0–17.7)
MCH: 31.1 pg (ref 26.6–33.0)
MCHC: 33.5 g/dL (ref 31.5–35.7)
MCV: 93 fL (ref 79–97)
Platelets: 216 10*3/uL (ref 150–450)
RBC: 4.73 x10E6/uL (ref 4.14–5.80)
RDW: 13.2 % (ref 11.6–15.4)
WBC: 5.3 10*3/uL (ref 3.4–10.8)

## 2023-09-29 LAB — BASIC METABOLIC PANEL
BUN/Creatinine Ratio: 15 (ref 10–24)
BUN: 15 mg/dL (ref 8–27)
CO2: 24 mmol/L (ref 20–29)
Calcium: 9.9 mg/dL (ref 8.6–10.2)
Chloride: 103 mmol/L (ref 96–106)
Creatinine, Ser: 1.02 mg/dL (ref 0.76–1.27)
Glucose: 98 mg/dL (ref 70–99)
Potassium: 4.7 mmol/L (ref 3.5–5.2)
Sodium: 141 mmol/L (ref 134–144)
eGFR: 75 mL/min/{1.73_m2} (ref >59)

## 2023-09-30 NOTE — Telephone Encounter (Signed)
 Patient with diagnosis of hx of PE and Afib (05/2022) on Eliquis  for anticoagulation.    Procedure: L TKA Date of procedure: 10/19/2023   CHA2DS2-VASc Score = 3   This indicates a 3.2% annual risk of stroke. The patient's score is based upon: CHF History: 0 HTN History: 1 Diabetes History: 0 Stroke History: 0 Vascular Disease History: 0 Age Score: 2 Gender Score: 0     CrCl 60 mL/min Platelet count 216 K   Per office protocol, patient can hold Eliquis  for 3 days prior to procedure.     **This guidance is not considered finalized until pre-operative APP has relayed final recommendations.**

## 2023-10-01 DIAGNOSIS — M25562 Pain in left knee: Secondary | ICD-10-CM | POA: Diagnosis not present

## 2023-10-04 DIAGNOSIS — M9905 Segmental and somatic dysfunction of pelvic region: Secondary | ICD-10-CM | POA: Diagnosis not present

## 2023-10-04 DIAGNOSIS — M9903 Segmental and somatic dysfunction of lumbar region: Secondary | ICD-10-CM | POA: Diagnosis not present

## 2023-10-04 DIAGNOSIS — M51361 Other intervertebral disc degeneration, lumbar region with lower extremity pain only: Secondary | ICD-10-CM | POA: Diagnosis not present

## 2023-10-04 DIAGNOSIS — M25569 Pain in unspecified knee: Secondary | ICD-10-CM | POA: Diagnosis not present

## 2023-10-04 DIAGNOSIS — M9904 Segmental and somatic dysfunction of sacral region: Secondary | ICD-10-CM | POA: Diagnosis not present

## 2023-10-05 DIAGNOSIS — M25569 Pain in unspecified knee: Secondary | ICD-10-CM | POA: Diagnosis not present

## 2023-10-08 ENCOUNTER — Ambulatory Visit: Payer: Medicare Other | Attending: Cardiology | Admitting: Student

## 2023-10-08 DIAGNOSIS — Z01810 Encounter for preprocedural cardiovascular examination: Secondary | ICD-10-CM

## 2023-10-08 NOTE — Progress Notes (Addendum)
 COVID Vaccine received:  []  No [x]  Yes Date of any COVID positive Test in last 90 days: no PCP - Dr. Ardell Manly Cardiologist - Dr. Aleene Passe  Cardiac clearance 10/08/23- Barnie Hila NP  Chest x-ray -  EKG -  10/20/22 Epic Stress Test -  ECHO - 06/24/22 Epic Cardiac Cath -   Bowel Prep - [x]  No  []   Yes ______  Pacemaker / ICD device [x]  No []  Yes   Spinal Cord Stimulator:[x]  No []  Yes       History of Sleep Apnea? [x]  No []  Yes   CPAP used?- [x]  No []  Yes    Does the patient monitor blood sugar?          [x]  No []  Yes  []  N/A  Patient has: [x]  NO Hx DM   []  Pre-DM                 []  DM1  []   DM2 Does patient have a [employer] or Dexacom? []  No []  Yes   Fasting Blood Sugar Ranges-  Checks Blood Sugar _____ times a day  GLP1 agonist / usual dose - no GLP1 instructions:  SGLT-2 inhibitors / usual dose - no SGLT-2 instructions:   Blood Thinner / Instructions:Eliquis - Hold 3 days prior to surgery.. Last dose to be 10/15/23 Aspirin Instructions:  Comments:   Activity level: Patient is able to climb a flight of stairs without difficulty; [x]  No CP  [x]  No SOB,___   Patient canperform ADLs without assistance.   Anesthesia review:   Patient denies shortness of breath, fever, cough and chest pain at PAT appointment.  Patient verbalized understanding and agreement to the Pre-Surgical Instructions that were given to them at this PAT appointment. Patient was also educated of the need to review these PAT instructions again prior to his/her surgery.I reviewed the appropriate phone numbers to call if they have any and questions or concerns.

## 2023-10-08 NOTE — Progress Notes (Signed)
 Virtual Visit via Telephone Note   Because of Juan Butler's co-morbid illnesses, he is at least at moderate risk for complications without adequate follow up.  This format is felt to be most appropriate for this patient at this time.  The patient did not have access to video technology/had technical difficulties with video requiring transitioning to audio format only (telephone).  All issues noted in this document were discussed and addressed.  No physical exam could be performed with this format.  Please refer to the patient's chart for his consent to telehealth for [facility].  Evaluation Performed:  Preoperative cardiovascular risk assessment _____________   Date:  10/08/2023   Patient ID:  Juan Butler, Juan Butler 31-Oct-1945, MRN [id number] Patient Location:  Home Provider location:   Office  Primary Care Provider:  Ransom Other, MD Primary Cardiologist:  Aleene Passe, MD  Chief Complaint / Patient Profile   78 y.o. y/o male with a h/o PAF on anticoagulation, hypertension, PE who is pending left total knee arthroplasty by Dr. Ernie on 10/19/2023 and presents today for telephonic preoperative cardiovascular risk assessment.  History of Present Illness    Juan Butler is a 78 y.o. male who presents via audio/video conferencing for a telehealth visit today.  Pt was last seen in cardiology clinic on 05/07/2023 by Dr. Passe.  At that time STEVIE CHARTER was stable from a cardiac standpoint.  The patient is now pending procedure as outlined above. Since his last visit, he is doing very well. Patient denies shortness of breath, dyspnea on exertion, lower extremity edema, orthopnea or PND. No chest pain, pressure, or tightness. No palpitations.  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   Past Medical History    Past Medical History:  Diagnosis  Date   A-fib [facility])    Anxiety    Enlarged prostate    Hypertension    No past surgical history on file.  Allergies  No Known Allergies  Home Medications    Prior to Admission medications   Medication Sig Start Date End Date Taking? Authorizing Provider  apixaban  (ELIQUIS ) 5 MG TABS tablet Take 1 tablet (5 mg total) by mouth 2 (two) times daily. 05/28/23   Lang Marinda Raddle., MD  diazepam  (VALIUM ) 5 MG tablet Take 5 mg by mouth daily as needed for anxiety. 05/16/22   [provider]  diltiazem  (CARDIZEM  CD) 120 MG 24 hr capsule Take 1 capsule (120 mg total) by mouth daily. 09/17/23   Fenton, Clint R, PA  fluocinonide (LIDEX) 0.05 % external solution APPLY 1 ML ON THE SKIN NIGHTLY 1-2 WEEKS AS NEEDED FLARES, TAPER AS ABLE    [provider]  fluticasone (CUTIVATE) 0.05 % cream APPLY 1 APPLICATION TOPICAL ONCE A DAY    [provider]  ketoconazole (NIZORAL) 2 % cream APPLY 1 APPLICATION ON SKIN TWICE A DAY TO FACE AS MAINTENANCE (CAN APPLY MIXED WITH HYDROCORTISONE)    [provider]  Multiple Vitamin (MULTIVITAMIN) tablet Take 1 tablet by mouth daily.    [provider]  tamsulosin  (FLOMAX ) 0.4 MG CAPS capsule Take 0.4 mg by mouth daily.    [provider]    Physical Exam    Vital Signs:  STEEL KERNEY does not have vital signs available for review today.  Given  telephonic nature of communication, physical exam is limited. AAOx3. NAD. Normal affect.  Speech and respirations are unlabored.  Assessment & Plan    Primary Cardiologist: Aleene Passe, MD  Preoperative cardiovascular risk assessment.  Left total knee arthroplasty by Dr. Ernie on 10/19/2023.  Chart reviewed as part of pre-operative protocol coverage. According to the RCRI, patient has a 0.4% risk of MACE. 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   Given past  medical history and time since last visit, based on ACC/AHA guidelines, MAGGIE DWORKIN would be at acceptable risk for the planned procedure without further cardiovascular testing.   Patient was advised that if he develops new symptoms prior to surgery to contact our office to arrange a follow-up appointment.  he verbalized understanding.  Per Pharm D, patient may hold Eliquis  for 3 days prior to procedure.    I will route this recommendation to the requesting party via Epic fax function.  Please call with questions.  Time:   Today, I have spent 7 minutes with the patient with telehealth technology discussing medical history, symptoms, and management plan.     Barnie Hila, NP  10/08/2023, 8:25 AM

## 2023-10-08 NOTE — Patient Instructions (Addendum)
 SURGICAL WAITING ROOM VISITATION  Patients having surgery or a procedure may have no more than 2 support people in the waiting area - these visitors may rotate.    Children under the age of 57 must have an adult with them who is not the patient.  Due to an increase in RSV and influenza rates and associated hospitalizations, children ages 7 and under may not visit patients in [facility] hospitals.  If the patient needs to stay at the hospital during part of their recovery, the visitor guidelines for inpatient rooms apply. Pre-op  nurse will coordinate an appropriate time for 1 support person to accompany patient in pre-op .  This support person may not rotate.    Please refer to the [facility] website for the visitor guidelines for Inpatients (after your surgery is over and you are in a regular room).       Your procedure is scheduled on: 10/19/23   Report to [facility] Main Entrance    Report to admitting at  7:30 AM   Call this number if you have problems the morning of surgery [phone]   Do not eat food :After Midnight.   After Midnight you may have the following liquids until 7 AM DAY OF SURGERY  Water  Non-Citrus Juices (without pulp, NO RED-Apple, White grape, White cranberry) Black Coffee (NO MILK/CREAM OR CREAMERS, sugar ok)  Clear Tea (NO MILK/CREAM OR CREAMERS, sugar ok) regular and decaf                             Plain Jell-O (NO RED)                                           Fruit ices (not with fruit pulp, NO RED)                                     Popsicles (NO RED)                                                               Sports drinks like Gatorade (NO RED)                The day of surgery:  Drink ONE (1) Pre-Surgery Clear Ensure 7 AM the morning of surgery. Drink in one sitting. Do not sip.  This drink was given to you during your hospital  pre-op  appointment visit. Nothing else to drink after completing the  Pre-Surgery Clear Ensure     Oral Hygiene is also important to reduce your risk of infection.                                    Remember - BRUSH YOUR TEETH THE MORNING OF SURGERY WITH YOUR REGULAR TOOTHPASTE   Stop all vitamins and herbal supplements 7 days before surgery.   Take these medicines the morning of surgery with A SIP OF WATER : Diazepam (valium ), Cardizem (diltiazem ), tamsulosin   You may not have any metal on your body including hair pins, jewelry, and body piercing             Do not wear make-up, lotions, powders, perfumes/cologne, or deodorant              Men may shave face and neck.   Do not bring valuables to the hospital. [facility] IS NOT             RESPONSIBLE   FOR VALUABLES.   Contacts, glasses, dentures or bridgework may not be worn into surgery.   Bring small overnight bag day of surgery.   DO NOT BRING YOUR HOME MEDICATIONS TO THE HOSPITAL. PHARMACY WILL DISPENSE MEDICATIONS LISTED ON YOUR MEDICATION LIST TO YOU DURING YOUR ADMISSION IN THE HOSPITAL!    Patients discharged on the day of surgery will not be allowed to drive home.  Someone NEEDS to stay with you for the first 24 hours after anesthesia.   Special Instructions: Bring a copy of your healthcare power of attorney and living will documents the day of surgery if you haven't scanned them before.              Please read over the following fact sheets you were given: IF YOU HAVE QUESTIONS ABOUT YOUR PRE-OP  INSTRUCTIONS PLEASE CALL [phone] Juan Butler   If you received a COVID test during your pre-op  visit  it is requested that you wear a mask when out in public, stay away from anyone that may not be feeling well and notify your surgeon if you develop symptoms. If you test positive for Covid or have been in contact with anyone that has tested positive in the last 10 days please notify you surgeon.      Pre-operative 5 CHG Bath Instructions   You can play a key role in reducing the risk of infection after surgery.  Your skin needs to be as free of germs as possible. You can reduce the number of germs on your skin by washing with CHG (chlorhexidine  gluconate) soap before surgery. CHG is an antiseptic soap that kills germs and continues to kill germs even after washing.   DO NOT use if you have an allergy to chlorhexidine /CHG or antibacterial soaps. If your skin becomes reddened or irritated, stop using the CHG and notify one of our RNs at [phone].   Please shower with the CHG soap starting 4 days before surgery using the following schedule:     Please keep in mind the following:  DO NOT shave, including legs and underarms, starting the day of your first shower.   You may shave your face at any point before/day of surgery.  Place clean sheets on your bed the day you start using CHG soap. Use a clean washcloth (not used since being washed) for each shower. DO NOT sleep with pets once you start using the CHG.   CHG Shower Instructions:  If you choose to wash your hair and private area, wash first with your normal shampoo/soap.  After you use shampoo/soap, rinse your hair and body thoroughly to remove shampoo/soap residue.  Turn the water  OFF and apply about 3 tablespoons (45 ml) of CHG soap to a CLEAN washcloth.  Apply CHG soap ONLY FROM YOUR NECK DOWN TO YOUR TOES (washing for 3-5 minutes)  DO NOT use CHG soap on face, private areas, open wounds, or sores.  Pay special attention to the area where your surgery is being performed.  If you are  having back surgery, having someone wash your back for you may be helpful. Wait 2 minutes after CHG soap is applied, then you may rinse off the CHG soap.  Pat dry with a clean towel  Put on clean clothes/pajamas   If you choose to wear lotion, please use ONLY the CHG-compatible lotions on the back of this paper.     Additional instructions for the day of surgery: DO NOT APPLY any lotions, deodorants, cologne, or perfumes.   Put on clean/comfortable clothes.   Brush your teeth.  Ask your nurse before applying any prescription medications to the skin.      CHG Compatible Lotions   Aveeno Moisturizing lotion  Cetaphil Moisturizing Cream  Cetaphil Moisturizing Lotion  Clairol Herbal Essence Moisturizing Lotion, Dry Skin  Clairol Herbal Essence Moisturizing Lotion, Extra Dry Skin  Clairol Herbal Essence Moisturizing Lotion, Normal Skin  Curel Age Defying Therapeutic Moisturizing Lotion with Alpha Hydroxy  Curel Extreme Care Body Lotion  Curel Soothing Hands Moisturizing Hand Lotion  Curel Therapeutic Moisturizing Cream, Fragrance-Free  Curel Therapeutic Moisturizing Lotion, Fragrance-Free  Curel Therapeutic Moisturizing Lotion, Original Formula  Eucerin Daily Replenishing Lotion  Eucerin Dry Skin Therapy Plus Alpha Hydroxy Crme  Eucerin Dry Skin Therapy Plus Alpha Hydroxy Lotion  Eucerin Original Crme  Eucerin Original Lotion  Eucerin Plus Crme Eucerin Plus Lotion  Eucerin TriLipid Replenishing Lotion  Keri Anti-Bacterial Hand Lotion  Keri Deep Conditioning Original Lotion Dry Skin Formula Softly Scented  Keri Deep Conditioning Original Lotion, Fragrance Free Sensitive Skin Formula  Keri Lotion Fast Absorbing Fragrance Free Sensitive Skin Formula  Keri Lotion Fast Absorbing Softly Scented Dry Skin Formula  Keri Original Lotion  Keri Skin Renewal Lotion Keri Silky Smooth Lotion  Keri Silky Smooth Sensitive Skin Lotion  Nivea Body Creamy Conditioning Oil  Nivea Body Extra Enriched Lotion  Nivea Body Original Lotion  Nivea Body Sheer Moisturizing Lotion Nivea Crme  Nivea Skin Firming Lotion  NutraDerm 30 Skin Lotion  NutraDerm Skin Lotion  NutraDerm Therapeutic Skin Cream  NutraDerm Therapeutic Skin Lotion  ProShield Protective Hand Cream  Incentive Spirometer (Watch this video at home: [email or web address])  An incentive spirometer is a tool that can help keep your lungs clear and active. This tool  measures how well you are filling your lungs with each breath. Taking long deep breaths may help reverse or decrease the chance of developing breathing (pulmonary) problems (especially infection) following: A long period of time when you are unable to move or be active. BEFORE THE PROCEDURE  If the spirometer includes an indicator to show your best effort, your nurse or respiratory therapist will set it to a desired goal. If possible, sit up straight or lean slightly forward. Try not to slouch. Hold the incentive spirometer in an upright position. INSTRUCTIONS FOR USE  Sit on the edge of your bed if possible, or sit up as far as you can in bed or on a chair. Hold the incentive spirometer in an upright position. Breathe out normally. Place the mouthpiece in your mouth and seal your lips tightly around it. Breathe in slowly and as deeply as possible, raising the piston or the ball toward the top of the column. Hold your breath for 3-5 seconds or for as long as possible. Allow the piston or ball to fall to the bottom of the column. Remove the mouthpiece from your mouth and breathe out normally. Rest for a few seconds and repeat Steps 1 through 7 at least 10 times every 1-2 hours  when you are awake. Take your time and take a few normal breaths between deep breaths. The spirometer may include an indicator to show your best effort. Use the indicator as a goal to work toward during each repetition. After each set of 10 deep breaths, practice coughing to be sure your lungs are clear. If you have an incision (the cut made at the time of surgery), support your incision when coughing by placing a pillow or rolled up towels firmly against it. Once you are able to get out of bed, walk around indoors and cough well. You may stop using the incentive spirometer when instructed by your caregiver.  RISKS AND COMPLICATIONS Take your time so you do not get dizzy or light-headed. If you are in pain, you may need to  take or ask for pain medication before doing incentive spirometry. It is harder to take a deep breath if you are having pain. AFTER USE Rest and breathe slowly and easily. It can be helpful to keep track of a log of your progress. Your caregiver can provide you with a simple table to help with this. If you are using the spirometer at home, follow these instructions: SEEK MEDICAL CARE IF:  You are having difficultly using the spirometer. You have trouble using the spirometer as often as instructed. Your pain medication is not giving enough relief while using the spirometer. You develop fever of 100.5 F (38.1 C) or higher. SEEK IMMEDIATE MEDICAL CARE IF:  You cough up bloody sputum that had not been present before. You develop fever of 102 F (38.9 C) or greater. You develop worsening pain at or near the incision site. MAKE SURE YOU:  Understand these instructions. Will watch your condition. Will get help right away if you are not doing well or get worse. Document Released: 01/25/2007 Document Revised: 12/07/2011 Document Reviewed: 03/28/2007 [facility] Patient Information 2014 [place], [place].

## 2023-10-11 ENCOUNTER — Encounter (HOSPITAL_COMMUNITY)
Admission: RE | Admit: 2023-10-11 | Discharge: 2023-10-11 | Disposition: A | Discharge status: Home / Self Care | Payer: Medicare Other | Source: Ambulatory Visit | Attending: Orthopedic Surgery

## 2023-10-11 ENCOUNTER — Other Ambulatory Visit: Payer: Self-pay

## 2023-10-11 ENCOUNTER — Encounter (HOSPITAL_COMMUNITY): Payer: Self-pay

## 2023-10-11 VITALS — BP 154/72 | HR 89 | Temp 97.9°F | Resp 16 | Ht 69.0 in | Wt 165.0 lb

## 2023-10-11 DIAGNOSIS — M9903 Segmental and somatic dysfunction of lumbar region: Secondary | ICD-10-CM | POA: Diagnosis not present

## 2023-10-11 DIAGNOSIS — Z01818 Encounter for other preprocedural examination: Secondary | ICD-10-CM

## 2023-10-11 DIAGNOSIS — M9905 Segmental and somatic dysfunction of pelvic region: Secondary | ICD-10-CM | POA: Diagnosis not present

## 2023-10-11 DIAGNOSIS — M9904 Segmental and somatic dysfunction of sacral region: Secondary | ICD-10-CM | POA: Diagnosis not present

## 2023-10-11 DIAGNOSIS — M25569 Pain in unspecified knee: Secondary | ICD-10-CM | POA: Diagnosis not present

## 2023-10-11 DIAGNOSIS — Z01812 Encounter for preprocedural laboratory examination: Secondary | ICD-10-CM | POA: Insufficient documentation

## 2023-10-11 DIAGNOSIS — M51361 Other intervertebral disc degeneration, lumbar region with lower extremity pain only: Secondary | ICD-10-CM | POA: Diagnosis not present

## 2023-10-11 HISTORY — DX: Cardiac arrhythmia, unspecified: I49.9

## 2023-10-11 HISTORY — DX: Unspecified osteoarthritis, unspecified site: M19.90

## 2023-10-14 DIAGNOSIS — M25569 Pain in unspecified knee: Secondary | ICD-10-CM | POA: Diagnosis not present

## 2023-10-16 ENCOUNTER — Other Ambulatory Visit (HOSPITAL_COMMUNITY): Payer: Self-pay | Admitting: Physician Assistant

## 2023-10-18 DIAGNOSIS — M9905 Segmental and somatic dysfunction of pelvic region: Secondary | ICD-10-CM | POA: Diagnosis not present

## 2023-10-18 DIAGNOSIS — M9903 Segmental and somatic dysfunction of lumbar region: Secondary | ICD-10-CM | POA: Diagnosis not present

## 2023-10-18 DIAGNOSIS — M9904 Segmental and somatic dysfunction of sacral region: Secondary | ICD-10-CM | POA: Diagnosis not present

## 2023-10-18 DIAGNOSIS — M25569 Pain in unspecified knee: Secondary | ICD-10-CM | POA: Diagnosis not present

## 2023-10-18 DIAGNOSIS — M51361 Other intervertebral disc degeneration, lumbar region with lower extremity pain only: Secondary | ICD-10-CM | POA: Diagnosis not present

## 2023-10-18 NOTE — H&P (Signed)
TOTAL KNEE ADMISSION H&P  Patient is being admitted for left total knee arthroplasty.  Therapy Plans: Deciding today between PT at [facility] and [facility] Disposition: Home with wife Planned DVT Prophylaxis: Eliquis mg BID DME needed: ice machine, walker PCP: Dr. Donette Larry Cardio: Dr. Elease Hashimoto - appointment on 1/10 (hx of a fib) -- taking diltiazem instead of metoprolol/amlodipine TXA: IV Allergies: tramadol - constipation Anesthesia Concerns: none BMI: 24.5 Last HgbA1c: nOt diabetic   Other: - Severe constipation leading to worsening hernia with tramadol - oxycodone, robaxin, tylenol - SEND MEDS AHEAD - ice machine at hospital  Subjective:  Chief Complaint:left knee pain.  HPI: Juan Butler, 78 y.o. male, has a history of pain and functional disability in the left knee due to arthritis and has failed non-surgical conservative treatments for greater than 12 weeks to includeNSAID's and/or analgesics, corticosteriod injections, and activity modification.  Onset of symptoms was gradual, starting 2 years ago with gradually worsening course since that time. The patient noted no past surgery on the left knee(s).  Patient currently rates pain in the left knee(s) at 8 out of 10 with activity. Patient has worsening of pain with activity and weight bearing and pain that interferes with activities of daily living.  Patient has evidence of joint space narrowing by imaging studies. There is no active infection.  Patient Active Problem List   Diagnosis Date Noted   Hypercoagulable state due to paroxysmal atrial fibrillation (HCC) 06/09/2022   Anxiety 06/03/2022   New onset a-fib (HCC) 06/03/2022   A-fib (HCC) 06/03/2022   Past Medical History:  Diagnosis Date   A-fib [facility])    Anxiety    Arthritis    Dysrhythmia    Enlarged prostate    Hypertension     Past Surgical History:  Procedure Laterality Date   CATARACT EXTRACTION Bilateral    HERNIA REPAIR Bilateral    TONSILLECTOMY      No current  facility-administered medications for this encounter.   Current Outpatient Medications  Medication Sig Dispense Refill Last Dose/Taking   apixaban (ELIQUIS) 5 MG TABS tablet Take 1 tablet (5 mg total) by mouth 2 (two) times daily. 60 tablet 6 Taking   diazepam (VALIUM) 5 MG tablet Take 5 mg by mouth daily as needed for anxiety.   Taking As Needed   fluticasone (CUTIVATE) 0.05 % cream Apply 1 Application topically daily as needed (psoriasis).   Taking As Needed   Multiple Vitamin (MULTIVITAMIN) tablet Take 1 tablet by mouth daily.   Taking   Multiple Vitamins-Minerals (PRESERVISION AREDS 2) CAPS Take 1 capsule by mouth 2 (two) times daily.   Taking   tamsulosin (FLOMAX) 0.4 MG CAPS capsule Take 0.4 mg by mouth daily.   Taking   diltiazem (CARDIZEM CD) 120 MG 24 hr capsule TAKE 1 CAPSULE BY MOUTH EVERY DAY 90 capsule 1    Allergies  Allergen Reactions   Codeine     Severe constipation     Social History   Tobacco Use   Smoking status: Never   Smokeless tobacco: Never   Tobacco comments:    Never smoke 06/09/22  Substance Use Topics   Alcohol use: Yes    Alcohol/week: 5.0 standard drinks of alcohol    Types: 5 Standard drinks or equivalent per week    Comment: 1 nightly drink 06/09/22    No family history on file.   Review of Systems  Constitutional:  Negative for chills and fever.  Respiratory:  Negative for cough and shortness of  breath.   Cardiovascular:  Negative for chest pain.  Gastrointestinal:  Negative for nausea and vomiting.  Musculoskeletal:  Positive for arthralgias.     Objective:  Physical Exam Well nourished and well developed. General: Alert and oriented x3, cooperative and pleasant, no acute distress. Head: normocephalic, atraumatic, neck supple. Eyes: EOMI.  Musculoskeletal: Left knee exam: No palpable effusion, warmth erythema He has about a 5 degree flexion contracture and flexes to about 110 degrees with tightness Tenderness over the medial  aspect knee Stable intact medial and lateral collateral ligaments Left hip exam: No reproducible groin or referred pain with passive range of motion no evidence of mild tightness Right knee exam: No palpable effusion warmth erythema Genu varum with slight flexion contracture less than the left knee Tenderness medially  Calves soft and nontender. Motor function intact in LE. Strength 5/5 LE bilaterally. Neuro: Distal pulses 2+. Sensation to light touch intact in LE.  Vital signs in last 24 hours:    Labs:   Estimated body mass index is 24.37 kg/m as calculated from the following:   Height as of 10/11/23: 5\' 9"  (1.753 m).   Weight as of 10/11/23: 74.8 kg.   Imaging Review Plain radiographs demonstrate severe degenerative joint disease of the left knee(s). The overall alignment isneutral. The bone quality appears to be adequate for age and reported activity level.      Assessment/Plan:  End stage arthritis, left knee   The patient history, physical examination, clinical judgment of the provider and imaging studies are consistent with end stage degenerative joint disease of the left knee(s) and total knee arthroplasty is deemed medically necessary. The treatment options including medical management, injection therapy arthroscopy and arthroplasty were discussed at length. The risks and benefits of total knee arthroplasty were presented and reviewed. The risks due to aseptic loosening, infection, stiffness, patella tracking problems, thromboembolic complications and other imponderables were discussed. The patient acknowledged the explanation, agreed to proceed with the plan and consent was signed. Patient is being admitted for inpatient treatment for surgery, pain control, PT, OT, prophylactic antibiotics, VTE prophylaxis, progressive ambulation and ADL's and discharge planning. The patient is planning to be discharged  home.     Patient's anticipated LOS is less than 2 midnights,  meeting these requirements: - Younger than 23 - Lives within 1 hour of care - Has a competent adult at home to recover with post-op recover - NO history of  - Chronic pain requiring opiods  - Diabetes  - Coronary Artery Disease  - Heart failure  - Heart attack  - Stroke  - DVT/VTE  - Cardiac arrhythmia  - Respiratory Failure/COPD  - Renal failure  - Anemia  - Advanced Liver disease  Rosalene Billings, PA-C Orthopedic Surgery [facility] [place] [phone]

## 2023-10-19 ENCOUNTER — Observation Stay (HOSPITAL_COMMUNITY)
Admission: RE | Admit: 2023-10-19 | Discharge: 2023-10-20 | Disposition: A | Discharge status: Home / Self Care | Payer: Medicare Other | Source: Ambulatory Visit | Attending: Orthopedic Surgery | Admitting: Orthopedic Surgery

## 2023-10-19 ENCOUNTER — Other Ambulatory Visit: Payer: Self-pay

## 2023-10-19 ENCOUNTER — Encounter (HOSPITAL_COMMUNITY): Payer: Self-pay | Admitting: Orthopedic Surgery

## 2023-10-19 ENCOUNTER — Encounter (HOSPITAL_COMMUNITY)
Admission: RE | Disposition: A | Discharge status: Home / Self Care | Payer: Self-pay | Source: Ambulatory Visit | Attending: Orthopedic Surgery

## 2023-10-19 ENCOUNTER — Ambulatory Visit (HOSPITAL_BASED_OUTPATIENT_CLINIC_OR_DEPARTMENT_OTHER): Payer: Medicare Other | Admitting: Anesthesiology

## 2023-10-19 ENCOUNTER — Ambulatory Visit (HOSPITAL_COMMUNITY): Payer: Medicare Other | Admitting: Anesthesiology

## 2023-10-19 DIAGNOSIS — I4891 Unspecified atrial fibrillation: Secondary | ICD-10-CM

## 2023-10-19 DIAGNOSIS — I480 Paroxysmal atrial fibrillation: Secondary | ICD-10-CM | POA: Diagnosis not present

## 2023-10-19 DIAGNOSIS — I10 Essential (primary) hypertension: Secondary | ICD-10-CM | POA: Diagnosis not present

## 2023-10-19 DIAGNOSIS — Z7901 Long term (current) use of anticoagulants: Secondary | ICD-10-CM | POA: Diagnosis not present

## 2023-10-19 DIAGNOSIS — F419 Anxiety disorder, unspecified: Secondary | ICD-10-CM

## 2023-10-19 DIAGNOSIS — M1712 Unilateral primary osteoarthritis, left knee: Secondary | ICD-10-CM

## 2023-10-19 DIAGNOSIS — Z79899 Other long term (current) drug therapy: Secondary | ICD-10-CM | POA: Diagnosis not present

## 2023-10-19 DIAGNOSIS — G8918 Other acute postprocedural pain: Secondary | ICD-10-CM | POA: Diagnosis not present

## 2023-10-19 DIAGNOSIS — Z96652 Presence of left artificial knee joint: Principal | ICD-10-CM

## 2023-10-19 DIAGNOSIS — Z01818 Encounter for other preprocedural examination: Secondary | ICD-10-CM

## 2023-10-19 HISTORY — PX: TOTAL KNEE ARTHROPLASTY: SHX125

## 2023-10-19 LAB — SURGICAL PCR SCREEN
MRSA, PCR: NEGATIVE
Staphylococcus aureus: POSITIVE — AB

## 2023-10-19 SURGERY — ARTHROPLASTY, KNEE, TOTAL
Anesthesia: Monitor Anesthesia Care | Site: Knee | Laterality: Left

## 2023-10-19 MED ORDER — POVIDONE-IODINE 10 % EX SWAB
2.0000 | Freq: Once | CUTANEOUS | Status: AC
Start: 2023-10-19 — End: 2023-10-19

## 2023-10-19 MED ORDER — SODIUM CHLORIDE (PF) 0.9 % IJ SOLN
INTRAMUSCULAR | Status: AC
  Filled 2023-10-19: qty 30

## 2023-10-19 MED ORDER — PHENYLEPHRINE 80 MCG/ML (10ML) SYRINGE FOR IV PUSH (FOR BLOOD PRESSURE SUPPORT)
PREFILLED_SYRINGE | INTRAVENOUS | Status: AC
  Filled 2023-10-19: qty 10

## 2023-10-19 MED ORDER — FENTANYL CITRATE PF 50 MCG/ML IJ SOSY: 25.0000 ug | PREFILLED_SYRINGE | INTRAMUSCULAR | Status: DC | PRN

## 2023-10-19 MED ORDER — APIXABAN 5 MG PO TABS
5.0000 mg | ORAL_TABLET | Freq: Two times a day (BID) | ORAL | Status: DC
Start: 2023-10-20 — End: 2023-10-20
  Administered 2023-10-20: 5 mg via ORAL
  Filled 2023-10-19: qty 1

## 2023-10-19 MED ORDER — SODIUM CHLORIDE (PF) 0.9 % IJ SOLN
INTRAMUSCULAR | Status: DC | PRN
  Administered 2023-10-19: 61 mL

## 2023-10-19 MED ORDER — METHOCARBAMOL 1000 MG/10ML IJ SOLN: 500.0000 mg | Freq: Four times a day (QID) | INTRAMUSCULAR | Status: DC | PRN

## 2023-10-19 MED ORDER — 0.9 % SODIUM CHLORIDE (POUR BTL) OPTIME
TOPICAL | Status: DC | PRN
  Administered 2023-10-19: 1000 mL

## 2023-10-19 MED ORDER — PROPOFOL 500 MG/50ML IV EMUL
INTRAVENOUS | Status: DC | PRN
  Administered 2023-10-19: 50 ug/kg/min via INTRAVENOUS

## 2023-10-19 MED ORDER — ROPIVACAINE HCL 5 MG/ML IJ SOLN
INTRAMUSCULAR | Status: DC | PRN
  Administered 2023-10-19: 20 mL via PERINEURAL

## 2023-10-19 MED ORDER — METOCLOPRAMIDE HCL 5 MG PO TABS: 5.0000 mg | ORAL_TABLET | Freq: Three times a day (TID) | ORAL | Status: DC | PRN

## 2023-10-19 MED ORDER — EPHEDRINE 5 MG/ML INJ
INTRAVENOUS | Status: AC
  Filled 2023-10-19: qty 5

## 2023-10-19 MED ORDER — SENNA 8.6 MG PO TABS
2.0000 | ORAL_TABLET | Freq: Every day | ORAL | Status: DC
  Administered 2023-10-19: 17.2 mg via ORAL
  Filled 2023-10-19: qty 2

## 2023-10-19 MED ORDER — OXYCODONE HCL 5 MG PO TABS: 5.0000 mg | ORAL_TABLET | Freq: Once | ORAL | Status: DC | PRN

## 2023-10-19 MED ORDER — DIPHENHYDRAMINE HCL 12.5 MG/5ML PO ELIX: 12.5000 mg | ORAL_SOLUTION | ORAL | Status: DC | PRN

## 2023-10-19 MED ORDER — METHOCARBAMOL 500 MG PO TABS: 500.0000 mg | ORAL_TABLET | Freq: Four times a day (QID) | ORAL | Status: DC | PRN

## 2023-10-19 MED ORDER — KETOROLAC TROMETHAMINE 30 MG/ML IJ SOLN
INTRAMUSCULAR | Status: AC
  Filled 2023-10-19: qty 1

## 2023-10-19 MED ORDER — CEFAZOLIN SODIUM-DEXTROSE 2-4 GM/100ML-% IV SOLN
2.0000 g | INTRAVENOUS | Status: AC
Start: 2023-10-19 — End: 2023-10-19
  Administered 2023-10-19: 2 g via INTRAVENOUS
  Filled 2023-10-19: qty 100

## 2023-10-19 MED ORDER — PHENYLEPHRINE 80 MCG/ML (10ML) SYRINGE FOR IV PUSH (FOR BLOOD PRESSURE SUPPORT)
PREFILLED_SYRINGE | INTRAVENOUS | Status: DC | PRN
  Administered 2023-10-19: 80 ug via INTRAVENOUS
  Administered 2023-10-19: 160 ug via INTRAVENOUS
  Administered 2023-10-19: 80 ug via INTRAVENOUS
  Administered 2023-10-19: 160 ug via INTRAVENOUS

## 2023-10-19 MED ORDER — TRANEXAMIC ACID-NACL 1000-0.7 MG/100ML-% IV SOLN
1000.0000 mg | Freq: Once | INTRAVENOUS | Status: AC
  Administered 2023-10-19: 1000 mg via INTRAVENOUS
  Filled 2023-10-19: qty 100

## 2023-10-19 MED ORDER — TRANEXAMIC ACID-NACL 1000-0.7 MG/100ML-% IV SOLN
1000.0000 mg | INTRAVENOUS | Status: AC
  Administered 2023-10-19: 1000 mg via INTRAVENOUS
  Filled 2023-10-19: qty 100

## 2023-10-19 MED ORDER — LACTATED RINGERS IV SOLN: INTRAVENOUS | Status: DC

## 2023-10-19 MED ORDER — MIDAZOLAM HCL 2 MG/2ML IJ SOLN
INTRAMUSCULAR | Status: DC | PRN
  Administered 2023-10-19: 2 mg via INTRAVENOUS

## 2023-10-19 MED ORDER — PROPOFOL 1000 MG/100ML IV EMUL
INTRAVENOUS | Status: AC
  Filled 2023-10-19: qty 100

## 2023-10-19 MED ORDER — DEXAMETHASONE SODIUM PHOSPHATE 10 MG/ML IJ SOLN
10.0000 mg | Freq: Once | INTRAMUSCULAR | Status: AC
  Administered 2023-10-20: 10 mg via INTRAVENOUS
  Filled 2023-10-19: qty 1

## 2023-10-19 MED ORDER — ACETAMINOPHEN 160 MG/5ML PO SOLN: 325.0000 mg | ORAL | Status: DC | PRN

## 2023-10-19 MED ORDER — BUPIVACAINE-EPINEPHRINE 0.25% -1:200000 IJ SOLN
INTRAMUSCULAR | Status: AC
  Filled 2023-10-19: qty 1

## 2023-10-19 MED ORDER — MIDAZOLAM HCL 2 MG/2ML IJ SOLN
2.0000 mg | Freq: Once | INTRAMUSCULAR | Status: AC
  Administered 2023-10-19: 2 mg via INTRAVENOUS
  Filled 2023-10-19: qty 2

## 2023-10-19 MED ORDER — ACETAMINOPHEN 500 MG PO TABS
1000.0000 mg | ORAL_TABLET | Freq: Four times a day (QID) | ORAL | Status: DC
  Administered 2023-10-19 – 2023-10-20 (×4): 1000 mg via ORAL
  Filled 2023-10-19 (×4): qty 2

## 2023-10-19 MED ORDER — MIDAZOLAM HCL 2 MG/2ML IJ SOLN
INTRAMUSCULAR | Status: AC
  Filled 2023-10-19: qty 2

## 2023-10-19 MED ORDER — CEFAZOLIN SODIUM-DEXTROSE 2-4 GM/100ML-% IV SOLN
2.0000 g | Freq: Four times a day (QID) | INTRAVENOUS | Status: AC
  Administered 2023-10-19 (×2): 2 g via INTRAVENOUS
  Filled 2023-10-19 (×2): qty 100

## 2023-10-19 MED ORDER — DEXAMETHASONE SODIUM PHOSPHATE 10 MG/ML IJ SOLN
8.0000 mg | Freq: Once | INTRAMUSCULAR | Status: AC
  Administered 2023-10-19: 10 mg via INTRAVENOUS

## 2023-10-19 MED ORDER — TAMSULOSIN HCL 0.4 MG PO CAPS
0.4000 mg | ORAL_CAPSULE | Freq: Every day | ORAL | Status: DC
Start: 2023-10-20 — End: 2023-10-20
  Administered 2023-10-20: 0.4 mg via ORAL
  Filled 2023-10-19: qty 1

## 2023-10-19 MED ORDER — ONDANSETRON HCL 4 MG/2ML IJ SOLN
INTRAMUSCULAR | Status: DC | PRN
  Administered 2023-10-19: 4 mg via INTRAVENOUS

## 2023-10-19 MED ORDER — PHENOL 1.4 % MT LIQD: 1.0000 | OROMUCOSAL | Status: DC | PRN

## 2023-10-19 MED ORDER — ALUM & MAG HYDROXIDE-SIMETH 200-200-20 MG/5ML PO SUSP: 30.0000 mL | ORAL | Status: DC | PRN

## 2023-10-19 MED ORDER — MUPIROCIN 2 % EX OINT: 1.0000 | TOPICAL_OINTMENT | Freq: Two times a day (BID) | CUTANEOUS | 0 refills | Status: AC

## 2023-10-19 MED ORDER — EPHEDRINE SULFATE (PRESSORS) 50 MG/ML IJ SOLN
INTRAMUSCULAR | Status: DC | PRN
  Administered 2023-10-19: 5 mg via INTRAVENOUS
  Administered 2023-10-19: 10 mg via INTRAVENOUS

## 2023-10-19 MED ORDER — BUPIVACAINE IN DEXTROSE 0.75-8.25 % IT SOLN
INTRATHECAL | Status: DC | PRN
  Administered 2023-10-19: 1.6 mL via INTRATHECAL

## 2023-10-19 MED ORDER — POLYETHYLENE GLYCOL 3350 17 G PO PACK
17.0000 g | PACK | Freq: Two times a day (BID) | ORAL | Status: DC
  Administered 2023-10-19 – 2023-10-20 (×3): 17 g via ORAL
  Filled 2023-10-19 (×3): qty 1

## 2023-10-19 MED ORDER — ONDANSETRON HCL 4 MG/2ML IJ SOLN
INTRAMUSCULAR | Status: AC
  Filled 2023-10-19: qty 2

## 2023-10-19 MED ORDER — MEPERIDINE HCL 50 MG/ML IJ SOLN
6.2500 mg | INTRAMUSCULAR | Status: DC | PRN
Start: 2023-10-19 — End: 2023-10-19

## 2023-10-19 MED ORDER — DIAZEPAM 5 MG PO TABS: 5.0000 mg | ORAL_TABLET | Freq: Every day | ORAL | Status: DC | PRN

## 2023-10-19 MED ORDER — MENTHOL 3 MG MT LOZG: 1.0000 | LOZENGE | OROMUCOSAL | Status: DC | PRN

## 2023-10-19 MED ORDER — ONDANSETRON HCL 4 MG/2ML IJ SOLN: 4.0000 mg | Freq: Once | INTRAMUSCULAR | Status: DC | PRN

## 2023-10-19 MED ORDER — SODIUM CHLORIDE 0.9% FLUSH: 3.0000 mL | INTRAVENOUS | Status: DC | PRN

## 2023-10-19 MED ORDER — OXYCODONE HCL 5 MG/5ML PO SOLN
5.0000 mg | Freq: Once | ORAL | Status: DC | PRN
Start: 2023-10-19 — End: 2023-10-19

## 2023-10-19 MED ORDER — ACETAMINOPHEN 325 MG PO TABS
325.0000 mg | ORAL_TABLET | ORAL | Status: DC | PRN
Start: 2023-10-19 — End: 2023-10-19

## 2023-10-19 MED ORDER — SODIUM CHLORIDE 0.9 % IR SOLN
Status: DC | PRN
  Administered 2023-10-19: 1000 mL

## 2023-10-19 MED ORDER — ORAL CARE MOUTH RINSE: 15.0000 mL | Freq: Once | OROMUCOSAL | Status: AC

## 2023-10-19 MED ORDER — OXYCODONE HCL 5 MG PO TABS: 10.0000 mg | ORAL_TABLET | ORAL | Status: DC | PRN

## 2023-10-19 MED ORDER — CHLORHEXIDINE GLUCONATE 4 % EX SOLN: 1.0000 | CUTANEOUS | 1 refills | Status: DC

## 2023-10-19 MED ORDER — STERILE WATER FOR IRRIGATION IR SOLN
Status: DC | PRN
  Administered 2023-10-19: 1000 mL

## 2023-10-19 MED ORDER — CHLORHEXIDINE GLUCONATE 0.12 % MT SOLN
15.0000 mL | Freq: Once | OROMUCOSAL | Status: AC
  Administered 2023-10-19: 15 mL via OROMUCOSAL

## 2023-10-19 MED ORDER — SODIUM CHLORIDE 0.9% FLUSH: 10.0000 mL | Freq: Two times a day (BID) | INTRAVENOUS | Status: DC

## 2023-10-19 MED ORDER — FENTANYL CITRATE PF 50 MCG/ML IJ SOSY
100.0000 ug | PREFILLED_SYRINGE | Freq: Once | INTRAMUSCULAR | Status: AC
Start: 2023-10-19 — End: 2023-10-19
  Administered 2023-10-19: 100 ug via INTRAVENOUS
  Filled 2023-10-19: qty 2

## 2023-10-19 MED ORDER — OXYCODONE HCL 5 MG PO TABS
5.0000 mg | ORAL_TABLET | ORAL | Status: DC | PRN
  Administered 2023-10-19 – 2023-10-20 (×3): 5 mg via ORAL
  Administered 2023-10-20: 10 mg via ORAL
  Filled 2023-10-19: qty 2
  Filled 2023-10-19 (×3): qty 1

## 2023-10-19 MED ORDER — DILTIAZEM HCL ER COATED BEADS 120 MG PO CP24
120.0000 mg | ORAL_CAPSULE | Freq: Every day | ORAL | Status: DC
  Administered 2023-10-20: 120 mg via ORAL
  Filled 2023-10-19: qty 1

## 2023-10-19 MED ORDER — METOCLOPRAMIDE HCL 5 MG/ML IJ SOLN: 5.0000 mg | Freq: Three times a day (TID) | INTRAMUSCULAR | Status: DC | PRN

## 2023-10-19 MED ORDER — BISACODYL 10 MG RE SUPP
10.0000 mg | Freq: Every day | RECTAL | Status: DC | PRN
Start: 2023-10-19 — End: 2023-10-20

## 2023-10-19 MED ORDER — SODIUM CHLORIDE 0.9% FLUSH
3.0000 mL | Freq: Two times a day (BID) | INTRAVENOUS | Status: DC
  Administered 2023-10-19 – 2023-10-20 (×2): 10 mL via INTRAVENOUS

## 2023-10-19 MED ORDER — DEXAMETHASONE SODIUM PHOSPHATE 10 MG/ML IJ SOLN
INTRAMUSCULAR | Status: AC
  Filled 2023-10-19: qty 1

## 2023-10-19 MED ORDER — ONDANSETRON HCL 4 MG PO TABS
4.0000 mg | ORAL_TABLET | Freq: Four times a day (QID) | ORAL | Status: DC | PRN
Start: 2023-10-19 — End: 2023-10-20

## 2023-10-19 MED ORDER — ONDANSETRON HCL 4 MG/2ML IJ SOLN: 4.0000 mg | Freq: Four times a day (QID) | INTRAMUSCULAR | Status: DC | PRN

## 2023-10-19 MED ORDER — HYDROMORPHONE HCL 1 MG/ML IJ SOLN: 0.5000 mg | INTRAMUSCULAR | Status: DC | PRN

## 2023-10-19 MED ORDER — ORAL CARE MOUTH RINSE: 15.0000 mL | OROMUCOSAL | Status: DC | PRN

## 2023-10-19 SURGICAL SUPPLY — 46 items
ATTUNE MED ANAT PAT 38 KNEE (Knees) IMPLANT
BAG COUNTER SPONGE SURGICOUNT (BAG) IMPLANT
BAG ZIPLOCK 12X15 (MISCELLANEOUS) IMPLANT
BASEPLATE TIB CMT FB PCKT SZ6 (Knees) IMPLANT
BLADE SAW SGTL 13.0X1.19X90.0M (BLADE) ×1 IMPLANT
BNDG ELASTIC 6INX 5YD STR LF (GAUZE/BANDAGES/DRESSINGS) ×1 IMPLANT
BOWL SMART MIX CTS (DISPOSABLE) ×1 IMPLANT
CEMENT HV SMART SET (Cement) ×2 IMPLANT
COMP FEM CMT ATTUNE 5 LT (Joint) ×1 IMPLANT
COMPONENT FEM CMT ATTUNE 5 LT (Joint) IMPLANT
COVER SURGICAL LIGHT HANDLE (MISCELLANEOUS) ×1 IMPLANT
CUFF TRNQT CYL 34X4.125X (TOURNIQUET CUFF) ×1 IMPLANT
DERMABOND ADVANCED .7 DNX12 (GAUZE/BANDAGES/DRESSINGS) ×1 IMPLANT
DRAPE U-SHAPE 47X51 STRL (DRAPES) ×1 IMPLANT
DRESSING AQUACEL AG SP 3.5X10 (GAUZE/BANDAGES/DRESSINGS) ×1 IMPLANT
DRSG AQUACEL AG SP 3.5X10 (GAUZE/BANDAGES/DRESSINGS) ×1
DURAPREP 26ML APPLICATOR (WOUND CARE) ×2 IMPLANT
ELECT REM PT RETURN 15FT ADLT (MISCELLANEOUS) ×1 IMPLANT
GLOVE BIO SURGEON STRL SZ 6 (GLOVE) ×1 IMPLANT
GLOVE BIOGEL PI IND STRL 6.5 (GLOVE) ×1 IMPLANT
GLOVE BIOGEL PI IND STRL 7.5 (GLOVE) ×1 IMPLANT
GLOVE ORTHO TXT STRL SZ7.5 (GLOVE) ×2 IMPLANT
GOWN STRL REUS W/ TWL LRG LVL3 (GOWN DISPOSABLE) ×2 IMPLANT
HOLDER FOLEY CATH W/STRAP (MISCELLANEOUS) IMPLANT
KIT TURNOVER KIT A (KITS) IMPLANT
LINER TIB FB ATTUNE 5X10 LT (Liner) IMPLANT
MANIFOLD NEPTUNE II (INSTRUMENTS) ×1 IMPLANT
NDL SAFETY ECLIPSE 18X1.5 (NEEDLE) IMPLANT
NS IRRIG 1000ML POUR BTL (IV SOLUTION) ×1 IMPLANT
PACK TOTAL KNEE CUSTOM (KITS) ×1 IMPLANT
PAD COLD SHLDR WRAP-ON (PAD) IMPLANT
PIN FIX SIGMA LCS THRD HI (PIN) IMPLANT
PROTECTOR NERVE ULNAR (MISCELLANEOUS) ×1 IMPLANT
SET HNDPC FAN SPRY TIP SCT (DISPOSABLE) ×1 IMPLANT
SET PAD KNEE POSITIONER (MISCELLANEOUS) ×1 IMPLANT
SPIKE FLUID TRANSFER (MISCELLANEOUS) ×2 IMPLANT
SUT MNCRL AB 4-0 PS2 18 (SUTURE) ×1 IMPLANT
SUT STRATAFIX PDS+ 0 24IN (SUTURE) ×1 IMPLANT
SUT VIC AB 1 CT1 36 (SUTURE) ×1 IMPLANT
SUT VIC AB 2-0 CT1 TAPERPNT 27 (SUTURE) ×2 IMPLANT
SYR 3ML LL SCALE MARK (SYRINGE) ×1 IMPLANT
TOWEL GREEN STERILE FF (TOWEL DISPOSABLE) ×1 IMPLANT
TRAY FOLEY MTR SLVR 16FR STAT (SET/KITS/TRAYS/PACK) ×1 IMPLANT
TUBE SUCTION HIGH CAP CLEAR NV (SUCTIONS) ×1 IMPLANT
WATER STERILE IRR 1000ML POUR (IV SOLUTION) ×2 IMPLANT
WRAP KNEE MAXI GEL POST OP (GAUZE/BANDAGES/DRESSINGS) ×1 IMPLANT

## 2023-10-19 NOTE — Anesthesia Preprocedure Evaluation (Addendum)
Anesthesia Evaluation  Patient identified by MRN, date of birth, ID band Patient awake    Reviewed: Allergy & Precautions, H&P , NPO status , Patient's Chart, lab work & pertinent test results  Airway Mallampati: II  TM Distance: >3 FB Neck ROM: Full    Dental no notable dental hx.    Pulmonary neg pulmonary ROS   Pulmonary exam normal breath sounds clear to auscultation       Cardiovascular Exercise Tolerance: Good hypertension, Pt. on medications + dysrhythmias Atrial Fibrillation  Rhythm:Regular Rate:Normal     Neuro/Psych   Anxiety     negative neurological ROS  negative psych ROS   GI/Hepatic negative GI ROS, Neg liver ROS,,,  Endo/Other  negative endocrine ROS    Renal/GU negative Renal ROS  negative genitourinary   Musculoskeletal negative musculoskeletal ROS (+) Arthritis ,    Abdominal   Peds negative pediatric ROS (+)  Hematology negative hematology ROS (+)   Anesthesia Other Findings   Reproductive/Obstetrics negative OB ROS                              Anesthesia Physical Anesthesia Plan  ASA: 3  Anesthesia Plan: MAC, Regional and Spinal   Post-op Pain Management: Tylenol PO (pre-op)*, Celebrex PO (pre-op)* and Regional block*   Induction: Intravenous  PONV Risk Score and Plan: 1 and Ondansetron, Propofol infusion and Treatment may vary due to age or medical condition  Airway Management Planned: Natural Airway, Simple Face Mask and Mask  Additional Equipment: None  Intra-op Plan:   Post-operative Plan:   Informed Consent: I have reviewed the patients History and Physical, chart, labs and discussed the procedure including the risks, benefits and alternatives for the proposed anesthesia with the patient or authorized representative who has indicated his/her understanding and acceptance.       Plan Discussed with: Anesthesiologist and CRNA  Anesthesia Plan  Comments: (  )        Anesthesia Quick Evaluation

## 2023-10-19 NOTE — Op Note (Signed)
NAME:  Juan Butler [facility]                      MEDICAL RECORD NO.:  [id number]                             FACILITY:  [facility]      PHYSICIAN:  Madlyn Frankel. Charlann Boxer, M.D.  DATE OF BIRTH:  [birth year]      DATE OF PROCEDURE:  10/19/2023                                     OPERATIVE REPORT         PREOPERATIVE DIAGNOSIS:  Left knee osteoarthritis.      POSTOPERATIVE DIAGNOSIS:  Left knee osteoarthritis.      FINDINGS:  The patient was noted to have complete loss of cartilage and   bone-on-bone arthritis with associated osteophytes in the medial and patellofemoral compartments of   the knee with a significant synovitis and associated effusion.  The patient had failed months of conservative treatment including medications, injection therapy, activity modification.     PROCEDURE:  Left total knee replacement.      COMPONENTS USED:  DePuy Attune FB CR MS knee   system, a size 5 femur, 6 tibia, size 10 mm CR MS  AOX insert, and 38 anatomic patellar   button.      SURGEON:  Madlyn Frankel. Charlann Boxer, M.D.      ASSISTANT:  Rosalene Billings, PA-C.      ANESTHESIA:  Regional and Spinal.      SPECIMENS:  None.      COMPLICATION:  None.      DRAINS:  None.  EBL: <100 cc      TOURNIQUET TIME:  33 min at 225 mmHg   The patient was stable to the recovery room.      INDICATION FOR PROCEDURE:  Juan Butler is a 78 y.o. male patient of   mine.  The patient had been seen, evaluated, and treated for months conservatively in the   office with medication, activity modification, and injections.  The patient had   radiographic changes of bone-on-bone arthritis with endplate sclerosis and osteophytes noted.  Based on the radiographic changes and failed conservative measures, the patient   decided to proceed with definitive treatment, total knee replacement.  Risks of infection, DVT, component failure, need for revision surgery, neurovascular injury were reviewed in the office setting.  The postop course was reviewed  stressing the efforts to maximize post-operative satisfaction and function.  Consent was obtained for benefit of pain   relief.      PROCEDURE IN DETAIL:  The patient was brought to the operative theater.   Once adequate anesthesia, preoperative antibiotics, 2 gm of Ancef,1 gm of Tranexamic Acid, and 10 mg of Decadron administered, the patient was positioned supine with a left thigh tourniquet placed.  The  left lower extremity was prepped and draped in sterile fashion.  A time-   out was performed identifying the patient, planned procedure, and the appropriate extremity.      The left lower extremity was placed in the [facility] leg holder.  The leg was   exsanguinated, tourniquet elevated to 225 mmHg.  A midline incision was   made followed by median parapatellar arthrotomy.  Following initial   exposure, attention was first  directed to the patella.  Precut   measurement was noted to be 26 mm.  I resected down to 14 mm and used a   38 anatomic patellar button to restore patellar height as well as cover the cut surface.      The lug holes were drilled and a metal shim was placed to protect the   patella from retractors and saw blade during the procedure.      At this point, attention was now directed to the femur.  The femoral   canal was opened with a drill, irrigated to try to prevent fat emboli.  An   intramedullary rod was passed at 5 degrees valgus, 9 mm of bone was   resected off the distal femur.  Following this resection, the tibia was   subluxated anteriorly.  Using the extramedullary guide, 2 mm of bone was resected off   the proximal medial tibia.  We confirmed the gap would be   stable medially and laterally with a size 7 spacer block as well as confirmed that the tibial cut was perpendicular in the coronal plane, checking with an alignment rod.      Once this was done, I sized the femur to be a size 5 in the anterior-   posterior dimension, chose a standard component based on  medial and   lateral dimension.  The size 5 rotation block was then pinned in   position anterior referenced using the C-clamp to set rotation.  The   anterior, posterior, and  chamfer cuts were made without difficulty nor   notching making certain that I was along the anterior cortex to help   with flexion gap stability.      The final box cut was made off the lateral aspect of distal femur.      At this point, the tibia was sized to be a size 6.  The size 6 tray was   then pinned in position through the medial third of the tubercle,   drilled, and keel punched.  Trial reduction was now carried with a 5 femur,  6 tibia, a size 10 mm CR MS insert, and the 38 anatomic patella botton.  The knee was brought to full extension with good flexion stability with the patella   tracking through the trochlea without application of pressure.  Given   all these findings the trial components removed.  Final components were   opened and cement was mixed.  The knee was irrigated with normal saline solution and pulse lavage.  The synovial lining was   then injected with 30 cc of 0.25% Marcaine with epinephrine, 1 cc of Toradol and 30 cc of NS for a total of 61 cc.     Final implants were then cemented onto cleaned and dried cut surfaces of bone with the knee brought to extension with a size 10 mm CR MS trial insert.      Once the cement had fully cured, excess cement was removed   throughout the knee.  I confirmed that I was satisfied with the range of   motion and stability, and the final size 10 mm CR MS AOX insert was chosen.  It was   placed into the knee.      The tourniquet had been let down at 33 minutes.  No significant   hemostasis was required.  The extensor mechanism was then reapproximated using #1 Vicryl and #1 Stratafix sutures with the knee   in flexion.  The   remaining wound was closed with 2-0 Vicryl and running 4-0 Monocryl.   The knee was cleaned, dried, dressed sterilely using  Dermabond and   Aquacel dressing.  The patient was then   brought to recovery room in stable condition, tolerating the procedure   well.   Please note that Physician Assistant, Rosalene Billings, PA-C was present for the entirety of the case, and was utilized for pre-operative positioning, peri-operative retractor management, general facilitation of the procedure and for primary wound closure at the end of the case.              Madlyn Frankel Charlann Boxer, M.D.    10/19/2023 10:03 AM

## 2023-10-19 NOTE — Interval H&P Note (Signed)
History and Physical Interval Note:  10/19/2023 8:41 AM  Juan Butler  has presented today for surgery, with the diagnosis of Left knee osteoarthritis.  The various methods of treatment have been discussed with the patient and family. After consideration of risks, benefits and other options for treatment, the patient has consented to  Procedure(s): TOTAL KNEE ARTHROPLASTY (Left) as a surgical intervention.  The patient's history has been reviewed, patient examined, no change in status, stable for surgery.  I have reviewed the patient's chart and labs.  Questions were answered to the patient's satisfaction.     Shelda Pal

## 2023-10-19 NOTE — Discharge Instructions (Signed)

## 2023-10-19 NOTE — Transfer of Care (Signed)
Immediate Anesthesia Transfer of Care Note  Patient: Juan Butler [facility]  Procedure(s) Performed: TOTAL KNEE ARTHROPLASTY (Left: Knee)  Patient Location: PACU  Anesthesia Type:Spinal  Level of Consciousness: awake, alert , and oriented  Airway & Oxygen Therapy: Patient Spontanous Breathing and Patient connected to face mask oxygen  Post-op Assessment: Report given to RN and Post -op Vital signs reviewed and stable  Post vital signs: Reviewed and stable  Last Vitals:  Vitals Value Taken Time  BP 111/57 10/19/23 1148  Temp    Pulse 72 10/19/23 1151  Resp 14 10/19/23 1151  SpO2 100 % 10/19/23 1151  Vitals shown include unfiled device data.  Last Pain:  Vitals:   10/19/23 0813  TempSrc: Oral  PainSc:          Complications: No notable events documented.

## 2023-10-19 NOTE — Anesthesia Postprocedure Evaluation (Signed)
Anesthesia Post Note  Patient: Juan Butler  Procedure(s) Performed: TOTAL KNEE ARTHROPLASTY (Left: Knee)     Patient location during evaluation: PACU Anesthesia Type: Regional, Spinal and MAC Level of consciousness: oriented and awake and alert Pain management: pain level controlled Vital Signs Assessment: post-procedure vital signs reviewed and stable Respiratory status: spontaneous breathing, respiratory function stable and patient connected to nasal cannula oxygen Cardiovascular status: blood pressure returned to baseline and stable Postop Assessment: no headache, no backache and no apparent nausea or vomiting Anesthetic complications: no   No notable events documented.  Last Vitals:  Vitals:   10/19/23 1415 10/19/23 1825  BP: (!) 150/67 134/62  Pulse: 74 72  Resp: 15 15  Temp: 36.7 C 36.7 C  SpO2: 100% 99%    Last Pain:  Vitals:   10/19/23 1945  TempSrc:   PainSc: 0-No pain                 Yamilette Garretson

## 2023-10-19 NOTE — Anesthesia Procedure Notes (Signed)
Spinal  Patient location during procedure: OR Start time: 10/19/2023 10:00 AM End time: 10/19/2023 10:04 AM Reason for block: surgical anesthesia Staffing Anesthesiologist: Bethena Midget, MD Performed by: Bethena Midget, MD Authorized by: Bethena Midget, MD   Preanesthetic Checklist Completed: patient identified, IV checked, site marked, risks and benefits discussed, surgical consent, monitors and equipment checked, pre-op evaluation and timeout performed Spinal Block Patient position: sitting Prep: DuraPrep Patient monitoring: heart rate, cardiac monitor, continuous pulse ox and blood pressure Approach: midline Location: L3-4 Injection technique: single-shot Needle Needle type: Sprotte  Needle gauge: 24 G Needle length: 9 cm Assessment Sensory level: T4 Events: CSF return

## 2023-10-19 NOTE — Care Plan (Signed)
Ortho Bundle Case Management Note  Patient Details  Name: MYKE MAMMONE MRN: [id number] Date of Birth: 06-May-1946                  L TKA on 10-19-23 DCP: Home with wife DME: RW ordered through [employer]. PT: Renew at [facility].    DME Arranged:  Walker rolling DME Agency:  [employer]    Additional Comments: Please contact me with any questions of if this plan should need to change.   Ennis Forts, RN,CCM [facility]  [phone] 10/19/2023, 10:54 AM

## 2023-10-19 NOTE — Evaluation (Signed)
Physical Therapy Evaluation Patient Details Name: Juan Butler MRN: [id number] DOB: 1946/01/12 Today's Date: 10/19/2023  History of Present Illness  78 yo male s/p L TKA on 10/19/23. PMH: anxiety, afib  Clinical Impression  Pt is s/p TKA resulting in the deficits listed below (see PT Problem List).  Pt  doing very well this pm, amb 80' with RW and CGA.  Anticipate steady progress in acute setting.  Pt will benefit from acute skilled PT to increase their independence and safety with mobility to allow discharge.          If plan is discharge home, recommend the following: A little help with walking and/or transfers;A little help with bathing/dressing/bathroom;Help with stairs or ramp for entrance;Assistance with cooking/housework;Assist for transportation   Can travel by private vehicle        Equipment Recommendations None recommended by PT  Recommendations for Other Services       Functional Status Assessment Patient has had a recent decline in their functional status and demonstrates the ability to make significant improvements in function in a reasonable and predictable amount of time.     Precautions / Restrictions Precautions Precautions: Fall;Knee Restrictions Weight Bearing Restrictions Per Provider Order: No Other Position/Activity Restrictions: WBAT      Mobility  Bed Mobility Overal bed mobility: Needs Assistance Bed Mobility: Supine to Sit     Supine to sit: Contact guard     General bed mobility comments: for safety    Transfers Overall transfer level: Needs assistance Equipment used: Rolling walker (2 wheels) Transfers: Sit to/from Stand Sit to Stand: Contact guard assist           General transfer comment: cues for hand placement and LLE position    Ambulation/Gait Ambulation/Gait assistance: Contact guard assist Gait Distance (Feet): 80 Feet Assistive device: Rolling walker (2 wheels) Gait Pattern/deviations: Step-to pattern        General Gait Details: cues for sequence and RW position  Stairs            Wheelchair Mobility     Tilt Bed    Modified Rankin (Stroke Patients Only)       Balance                                             Pertinent Vitals/Pain Pain Assessment Pain Assessment: 0-10 Pain Score: 4  Pain Location: left knee Pain Descriptors / Indicators: Grimacing, Guarding, Sore Pain Intervention(s): Limited activity within patient's tolerance, Monitored during session, Premedicated before session, Repositioned, Patient requesting pain meds-RN notified    Home Living Family/patient expects to be discharged to:: Private residence Living Arrangements: Spouse/significant other   Type of Home: House Home Access: Stairs to enter   Secretary/administrator of Steps: 1 Alternate Level Stairs-Number of Steps: 1 step to bedroom Home Layout: Two level Home Equipment: None      Prior Function Prior Level of Function : Independent/Modified Independent;Working/employed             Mobility Comments: works/volunteers with [employer]       Extremity/Trunk Assessment   Upper Extremity Assessment Upper Extremity Assessment: Overall WFL for tasks assessed    Lower Extremity Assessment Lower Extremity Assessment: LLE deficits/detail LLE Deficits / Details: ankle WFL, knee extension and hip flexion 2+/5       Communication   Communication Communication: No apparent difficulties  Cognition Arousal:  Alert Behavior During Therapy: WFL for tasks assessed/performed Overall Cognitive Status: Within Functional Limits for tasks assessed                                          General Comments      Exercises Total Joint Exercises Ankle Circles/Pumps: AROM, 5 reps, Both   Assessment/Plan    PT Assessment Patient needs continued PT services  PT Problem List Decreased strength;Decreased activity tolerance;Decreased mobility;Decreased knowledge  of precautions;Pain;Decreased knowledge of use of DME       PT Treatment Interventions DME instruction;Gait training;Functional mobility training;Therapeutic activities;Therapeutic exercise;Patient/family education;Stair training    PT Goals (Current goals can be found in the Care Plan section)  Acute Rehab PT Goals Patient Stated Goal: return to independence PT Goal Formulation: With patient Time For Goal Achievement: 10/26/23 Potential to Achieve Goals: Good    Frequency 7X/week     Co-evaluation               AM-PAC PT "6 Clicks" Mobility  Outcome Measure Help needed turning from your back to your side while in a flat bed without using bedrails?: A Little Help needed moving from lying on your back to sitting on the side of a flat bed without using bedrails?: A Little Help needed moving to and from a bed to a chair (including a wheelchair)?: A Little Help needed standing up from a chair using your arms (e.g., wheelchair or bedside chair)?: A Little Help needed to walk in hospital room?: A Little Help needed climbing 3-5 steps with a railing? : A Little 6 Click Score: 18    End of Session Equipment Utilized During Treatment: Gait belt Activity Tolerance: Patient tolerated treatment well Patient left: with call bell/phone within reach;in chair;with chair alarm set Nurse Communication: Mobility status PT Visit Diagnosis: Other abnormalities of gait and mobility (R26.89)    Time: 4098-1191 PT Time Calculation (min) (ACUTE ONLY): 22 min   Charges:   PT Evaluation $PT Eval Low Complexity: 1 Low   PT General Charges $$ ACUTE PT VISIT: 1 Visit         Teandra Harlan, PT  Acute Rehab Dept [facility]) [phone]  10/19/2023   [facility] 10/19/2023, 5:06 PM

## 2023-10-19 NOTE — Anesthesia Procedure Notes (Signed)
Anesthesia Regional Block: Adductor canal block   Pre-Anesthetic Checklist: , timeout performed,  Correct Patient, Correct Site, Correct Laterality,  Correct Procedure, Correct Position, site marked,  Risks and benefits discussed,  Surgical consent,  Pre-op evaluation,  At surgeon's request and post-op pain management  Laterality: Left  Prep: chloraprep       Needles:  Injection technique: Single-shot  Needle Type: Echogenic Stimulator Needle     Needle Length: 5cm  Needle Gauge: 22     Additional Needles:   Procedures:,,,, ultrasound used (permanent image in chart),,    Narrative:  Start time: 10/19/2023 8:49 AM End time: 10/19/2023 8:55 AM Injection made incrementally with aspirations every 5 mL.  Performed by: Personally  Anesthesiologist: Bethena Midget, MD  Additional Notes: Functioning IV was confirmed and monitors were applied.  A 50mm 22ga Arrow echogenic stimulator needle was used. Sterile prep and drape,hand hygiene and sterile gloves were used. Ultrasound guidance: relevant anatomy identified, needle position confirmed, local anesthetic spread visualized around nerve(s)., vascular puncture avoided.  Image printed for medical record. Negative aspiration and negative test dose prior to incremental administration of local anesthetic. The patient tolerated the procedure well.

## 2023-10-20 ENCOUNTER — Encounter (HOSPITAL_COMMUNITY): Payer: Self-pay | Admitting: Orthopedic Surgery

## 2023-10-20 DIAGNOSIS — M1712 Unilateral primary osteoarthritis, left knee: Secondary | ICD-10-CM | POA: Diagnosis not present

## 2023-10-20 DIAGNOSIS — Z7901 Long term (current) use of anticoagulants: Secondary | ICD-10-CM | POA: Diagnosis not present

## 2023-10-20 DIAGNOSIS — I4891 Unspecified atrial fibrillation: Secondary | ICD-10-CM | POA: Diagnosis not present

## 2023-10-20 DIAGNOSIS — I10 Essential (primary) hypertension: Secondary | ICD-10-CM | POA: Diagnosis not present

## 2023-10-20 DIAGNOSIS — Z79899 Other long term (current) drug therapy: Secondary | ICD-10-CM | POA: Diagnosis not present

## 2023-10-20 LAB — BASIC METABOLIC PANEL
Anion gap: 7 (ref 5–15)
BUN: 16 mg/dL (ref 8–23)
CO2: 26 mmol/L (ref 22–32)
Calcium: 9.3 mg/dL (ref 8.9–10.3)
Chloride: 105 mmol/L (ref 98–111)
Creatinine, Ser: 0.74 mg/dL (ref 0.61–1.24)
GFR, Estimated: >60 mL/min (ref >60)
Glucose, Bld: 170 mg/dL — ABNORMAL HIGH (ref 70–99)
Potassium: 4.4 mmol/L (ref 3.5–5.1)
Sodium: 138 mmol/L (ref 135–145)

## 2023-10-20 LAB — CBC
HCT: 40.8 % (ref 39.0–52.0)
Hemoglobin: 13.8 g/dL (ref 13.0–17.0)
MCH: 31.5 pg (ref 26.0–34.0)
MCHC: 33.8 g/dL (ref 30.0–36.0)
MCV: 93.2 fL (ref 80.0–100.0)
Platelets: 189 10*3/uL (ref 150–400)
RBC: 4.38 MIL/uL (ref 4.22–5.81)
RDW: 13.1 % (ref 11.5–15.5)
WBC: 10.7 10*3/uL — ABNORMAL HIGH (ref 4.0–10.5)
nRBC: 0 % (ref 0.0–0.2)

## 2023-10-20 MED ORDER — OXYCODONE HCL 5 MG PO TABS: 5.0000 mg | ORAL_TABLET | ORAL | 0 refills | Status: DC | PRN

## 2023-10-20 MED ORDER — POLYETHYLENE GLYCOL 3350 17 G PO PACK: 17.0000 g | PACK | Freq: Two times a day (BID) | ORAL | 0 refills | Status: AC

## 2023-10-20 MED ORDER — METHOCARBAMOL 500 MG PO TABS: 500.0000 mg | ORAL_TABLET | Freq: Four times a day (QID) | ORAL | 2 refills | Status: DC | PRN

## 2023-10-20 MED ORDER — SENNA 8.6 MG PO TABS: 2.0000 | ORAL_TABLET | Freq: Every day | ORAL | 0 refills | Status: AC

## 2023-10-20 NOTE — Progress Notes (Signed)
Patient discharged to home w/ family. Given all belongings, instructions, equipment. Verbalized understanding of instructions. Escorted to pov via w/c. 

## 2023-10-20 NOTE — Plan of Care (Signed)
  Problem: Health Behavior/Discharge Planning: Goal: Ability to manage health-related needs will improve Outcome: Adequate for Discharge   Problem: Clinical Measurements: Goal: Ability to maintain clinical measurements within normal limits will improve Outcome: Progressing Goal: Will remain free from infection Outcome: Progressing Goal: Diagnostic test results will improve Outcome: Progressing Goal: Respiratory complications will improve Outcome: Adequate for Discharge Goal: Cardiovascular complication will be avoided Outcome: Progressing   Problem: Activity: Goal: Risk for activity intolerance will decrease Outcome: Adequate for Discharge   Problem: Nutrition: Goal: Adequate nutrition will be maintained Outcome: Completed/Met   Problem: Coping: Goal: Level of anxiety will decrease Outcome: Progressing   Problem: Elimination: Goal: Will not experience complications related to bowel motility Outcome: Progressing Goal: Will not experience complications related to urinary retention Outcome: Progressing   Problem: Pain Managment: Goal: General experience of comfort will improve and/or be controlled Outcome: Progressing   Problem: Safety: Goal: Ability to remain free from injury will improve Outcome: Progressing   Problem: Skin Integrity: Goal: Risk for impaired skin integrity will decrease Outcome: Progressing   Problem: Education: Goal: Knowledge of the prescribed therapeutic regimen will improve Outcome: Progressing Goal: Individualized Educational Video(s) Outcome: Completed/Met   Problem: Activity: Goal: Ability to avoid complications of mobility impairment will improve Outcome: Progressing Goal: Range of joint motion will improve Outcome: Adequate for Discharge   Problem: Clinical Measurements: Goal: Postoperative complications will be avoided or minimized Outcome: Progressing   Problem: Pain Management: Goal: Pain level will decrease with appropriate  interventions Outcome: Progressing   Problem: Skin Integrity: Goal: Will show signs of wound healing Outcome: Progressing

## 2023-10-20 NOTE — Progress Notes (Signed)
Physical Therapy Treatment Patient Details Name: Juan Butler MRN: [id number] DOB: Apr 15, 1946 Today's Date: 10/20/2023   History of Present Illness 78 yo male s/p L TKA on 10/19/23. PMH: anxiety, afib    PT Comments  POD # 1 am session General Comments: AxO x 3 pleasant and motivated  Assisted OOB to amb in hallway and practice ONE step he has to enter home all went well.   Then returned to room to perform some TE's following HEP handout.  Instructed on proper tech, freq as well as use of ICE.   Will see pt again later today to complete HEP     If plan is discharge home, recommend the following: A little help with walking and/or transfers;A little help with bathing/dressing/bathroom;Help with stairs or ramp for entrance;Assistance with cooking/housework;Assist for transportation   Can travel by private vehicle        Equipment Recommendations  None recommended by PT    Recommendations for Other Services       Precautions / Restrictions Precautions Precautions: Fall;Knee Precaution Comments: no pillow under knee Restrictions Weight Bearing Restrictions Per Provider Order: No Other Position/Activity Restrictions: WBAT     Mobility  Bed Mobility Overal bed mobility: Needs Assistance Bed Mobility: Supine to Sit           General bed mobility comments: demonstarted and instructed how to use a belt to self asisst LE off bed    Transfers Overall transfer level: Needs assistance Equipment used: Rolling walker (2 wheels) Transfers: Sit to/from Stand Sit to Stand: Contact guard assist, Supervision           General transfer comment: cues for hand placement and LLE position    Ambulation/Gait Ambulation/Gait assistance: Contact guard assist Gait Distance (Feet): 85 Feet Assistive device: Rolling walker (2 wheels) Gait Pattern/deviations: Step-to pattern       General Gait Details: cues for sequence and RW position   Stairs Stairs: Yes Stairs assistance:  Supervision, Contact guard assist Stair Management: No rails, Step to pattern, Forwards, With walker Number of Stairs: 1 General stair comments: practiced twice ONE step pt has to enter home with VC's on proper suquencing and walker placement/safety   Wheelchair Mobility     Tilt Bed    Modified Rankin (Stroke Patients Only)       Balance                                            Cognition Arousal: Alert Behavior During Therapy: WFL for tasks assessed/performed Overall Cognitive Status: Within Functional Limits for tasks assessed                                 General Comments: AxO x 3 pleasant and motivated        Exercises  Total Knee Replacement TE's following HEP handout 10 reps B LE ankle pumps 05 reps towel squeezes 05 reps knee presses 05 reps heel slides  05 reps SAQ's 05 reps SLR's 05 reps ABD Educated on use of gait belt to assist with TE's Followed by ICE     General Comments        Pertinent Vitals/Pain Pain Assessment Pain Assessment: 0-10 Pain Score: 3  Pain Location: left knee Pain Descriptors / Indicators: Grimacing, Guarding, Sore Pain Intervention(s): Monitored during session, Premedicated  before session, Repositioned, Ice applied    Home Living                          Prior Function            PT Goals (current goals can now be found in the care plan section) Progress towards PT goals: Progressing toward goals    Frequency    7X/week      PT Plan      Co-evaluation              AM-PAC PT "6 Clicks" Mobility   Outcome Measure  Help needed turning from your back to your side while in a flat bed without using bedrails?: None Help needed moving from lying on your back to sitting on the side of a flat bed without using bedrails?: None Help needed moving to and from a bed to a chair (including a wheelchair)?: A Little Help needed standing up from a chair using your arms  (e.g., wheelchair or bedside chair)?: A Little Help needed to walk in hospital room?: A Little Help needed climbing 3-5 steps with a railing? : A Little 6 Click Score: 20    End of Session Equipment Utilized During Treatment: Gait belt Activity Tolerance: Patient tolerated treatment well Patient left: with call bell/phone within reach;in chair;with chair alarm set Nurse Communication: Mobility status PT Visit Diagnosis: Other abnormalities of gait and mobility (R26.89)     Time: 0865-7846 PT Time Calculation (min) (ACUTE ONLY): 25 min  Charges:    $Gait Training: 8-22 mins $Therapeutic Exercise: 8-22 mins PT General Charges $$ ACUTE PT VISIT: 1 Visit                    Felecia Shelling  PTA Acute  Rehabilitation Services Office M-F          [phone]

## 2023-10-20 NOTE — TOC Transition Note (Signed)
Transition of Care [facility]) - Discharge Note   Patient Details  Name: Juan Butler MRN: [id number] Date of Birth: [birth year]  Transition of Care [facility]) CM/SW Contact:  Amada Jupiter, LCSW Phone Number: 10/20/2023, 10:30 AM   Clinical Narrative:     Met with pt who confirms OPPT already arranged with [facility] [employer].  RW has been delivered to room via [employer].  No further TOC needs.  Final next level of care: OP Rehab Barriers to Discharge: No Barriers Identified   Patient Goals and CMS Choice Patient states their goals for this hospitalization and ongoing recovery are:: return home          Discharge Placement                       Discharge Plan and Services Additional resources added to the After Visit Summary for                  DME Arranged: Walker rolling DME Agency: [employer]                  Social Drivers of Health (SDOH) Interventions SDOH Screenings   Food Insecurity: No Food Insecurity (10/19/2023)  Housing: Low Risk  (10/19/2023)  Transportation Needs: No Transportation Needs (10/19/2023)  Utilities: Not At Risk (10/19/2023)  Social Connections: Socially Integrated (10/19/2023)  Tobacco Use: Low Risk  (10/19/2023)     Readmission Risk Interventions     No data to display

## 2023-10-20 NOTE — Progress Notes (Signed)
   Subjective: 1 Day Post-Op Procedure(s) (LRB): TOTAL KNEE ARTHROPLASTY (Left) Patient reports pain as mild.   Patient seen in rounds with Dr. Charlann Boxer. Patient is well, and has had no acute complaints or problems. No acute events overnight. Foley catheter removed. Patient ambulated 80 feet with PT.  We will start therapy today.   Objective: Vital signs in last 24 hours: Temp:  [97.5 F (36.4 C)-98.3 F (36.8 C)] 98.2 F (36.8 C) (01/22 0548) Pulse Rate:  [63-79] 65 (01/22 0548) Resp:  [15-31] 18 (01/22 0548) BP: (90-159)/(57-79) 140/65 (01/22 0548) SpO2:  [89 %-100 %] 97 % (01/22 0548)  Intake/Output from previous day:  Intake/Output Summary (Last 24 hours) at 10/20/2023 0739 Last data filed at 10/20/2023 0548 Gross per 24 hour  Intake 2724.07 ml  Output 2350 ml  Net 374.07 ml     Intake/Output this shift: No intake/output data recorded.  Labs: Recent Labs    10/20/23 0324  HGB 13.8   Recent Labs    10/20/23 0324  WBC 10.7*  RBC 4.38  HCT 40.8  PLT 189   Recent Labs    10/20/23 0324  NA 138  K 4.4  CL 105  CO2 26  BUN 16  CREATININE 0.74  GLUCOSE 170*  CALCIUM 9.3   No results for input(s): "LABPT", "INR" in the last 72 hours.  Exam: General - Patient is Alert and Oriented Extremity - Neurologically intact Sensation intact distally Intact pulses distally Dorsiflexion/Plantar flexion intact Dressing - dressing C/D/I Motor Function - intact, moving foot and toes well on exam.   Pulse regular this morning  Past Medical History:  Diagnosis Date   A-fib (HCC)    Anxiety    Arthritis    Dysrhythmia    Enlarged prostate    Hypertension     Assessment/Plan: 1 Day Post-Op Procedure(s) (LRB): TOTAL KNEE ARTHROPLASTY (Left) Principal Problem:   S/P total knee replacement, left Active Problems:   S/P total knee arthroplasty, left  Estimated body mass index is 24.37 kg/m as calculated from the following:   Height as of this encounter: 5\' 9"   (1.753 m).   Weight as of this encounter: 74.8 kg. Advance diet Up with therapy D/C IV fluids   Patient's anticipated LOS is less than 2 midnights, meeting these requirements: - Younger than 78 - Lives within 1 hour of care - Has a competent adult at home to recover with post-op recover - NO history of  - Chronic pain requiring opiods  - Diabetes  - Coronary Artery Disease  - Heart failure  - Heart attack  - Stroke  - DVT/VTE  - Cardiac arrhythmia  - Respiratory Failure/COPD  - Renal failure  - Anemia  - Advanced Liver disease     DVT Prophylaxis -  eliquis Weight bearing as tolerated.  Hgb stable at 13.8 this AM.  Plan is to go Home after hospital stay. Plan for discharge today following 1-2 sessions of PT as long as they are meeting their goals. Patient is scheduled for OPPT. Follow up in the office in 2 weeks.   Rosalene Billings, PA-C Orthopedic Surgery [phone] 10/20/2023, 7:39 AM

## 2023-10-20 NOTE — Progress Notes (Signed)
Physical Therapy Treatment Patient Details Name: Juan Butler MRN: [id number] DOB: 1946/06/14 Today's Date: 10/20/2023   History of Present Illness 78 yo male s/p L TKA on 10/19/23. PMH: anxiety, afib    PT Comments  POD # 1 pm session Assisted out of recliner to amb to bathroom then in hallway.  General stair comments: practiced trwice.  Pt was able to recall proper tech from this morning session. Then returned to room to perform some TE's following HEP handout.  Instructed on proper tech, freq as well as use of ICE.   Addressed all mobility questions, discussed appropriate activity, educated on use of ICE.  Pt ready for D/C to home.    If plan is discharge home, recommend the following: A little help with walking and/or transfers;A little help with bathing/dressing/bathroom;Help with stairs or ramp for entrance;Assistance with cooking/housework;Assist for transportation   Can travel by private vehicle        Equipment Recommendations  None recommended by PT    Recommendations for Other Services       Precautions / Restrictions Precautions Precautions: Fall;Knee Precaution Comments: no pillow under knee Restrictions Weight Bearing Restrictions Per Provider Order: No Other Position/Activity Restrictions: WBAT     Mobility  Bed Mobility Overal bed mobility: Needs Assistance Bed Mobility: Supine to Sit           General bed mobility comments: OOB in recliner    Transfers Overall transfer level: Needs assistance Equipment used: Rolling walker (2 wheels) Transfers: Sit to/from Stand Sit to Stand: Contact guard assist, Supervision           General transfer comment: cues for hand placement and LLE position.  Also assisted with a toilet transfer.    Ambulation/Gait Ambulation/Gait assistance: Contact guard assist Gait Distance (Feet): 65 Feet Assistive device: Rolling walker (2 wheels) Gait Pattern/deviations: Step-to pattern Gait velocity: decreased      General Gait Details: cues for sequence and RW position   Stairs Stairs: Yes Stairs assistance: Supervision, Contact guard assist Stair Management: No rails, Step to pattern, Forwards, With walker Number of Stairs: 1 General stair comments: practiced trwice.  Pt was able to recall proper tech from this morning session.   Wheelchair Mobility     Tilt Bed    Modified Rankin (Stroke Patients Only)       Balance                                            Cognition Arousal: Alert Behavior During Therapy: WFL for tasks assessed/performed Overall Cognitive Status: Within Functional Limits for tasks assessed                                 General Comments: AxO x 3 pleasant and motivated        Exercises  05 reps all seated TE's following HEP handout    General Comments        Pertinent Vitals/Pain Pain Assessment Pain Assessment: Faces Pain Score: 3  Faces Pain Scale: Hurts little more Pain Location: left knee Pain Descriptors / Indicators: Grimacing, Guarding, Sore Pain Intervention(s): Monitored during session, Premedicated before session, Repositioned, Patient requesting pain meds-RN notified, Ice applied    Home Living  Prior Function            PT Goals (current goals can now be found in the care plan section) Progress towards PT goals: Progressing toward goals    Frequency    7X/week      PT Plan      Co-evaluation              AM-PAC PT "6 Clicks" Mobility   Outcome Measure  Help needed turning from your back to your side while in a flat bed without using bedrails?: None Help needed moving from lying on your back to sitting on the side of a flat bed without using bedrails?: None Help needed moving to and from a bed to a chair (including a wheelchair)?: A Little Help needed standing up from a chair using your arms (e.g., wheelchair or bedside chair)?: A Little Help  needed to walk in hospital room?: A Little Help needed climbing 3-5 steps with a railing? : A Little 6 Click Score: 20    End of Session Equipment Utilized During Treatment: Gait belt Activity Tolerance: Patient tolerated treatment well Patient left: with call bell/phone within reach;in chair;with chair alarm set Nurse Communication: Mobility status PT Visit Diagnosis: Other abnormalities of gait and mobility (R26.89)     Time: 0102-7253 PT Time Calculation (min) (ACUTE ONLY): 25 min  Charges:    $Gait Training: 8-22 mins $Therapeutic Exercise: 8-22 mins PT General Charges $$ ACUTE PT VISIT: 1 Visit                     Felecia Shelling  PTA Acute  Rehabilitation Services Office M-F          [phone]

## 2023-10-20 NOTE — Care Management Obs Status (Signed)
MEDICARE OBSERVATION STATUS NOTIFICATION   Patient Details  Name: Juan Butler MRN: [id number] Date of Birth: June 14, 1946   Medicare Observation Status Notification Given:  Yes    Amada Jupiter, LCSW 10/20/2023, 10:29 AM

## 2023-10-25 DIAGNOSIS — M25569 Pain in unspecified knee: Secondary | ICD-10-CM | POA: Diagnosis not present

## 2023-10-28 DIAGNOSIS — M25569 Pain in unspecified knee: Secondary | ICD-10-CM | POA: Diagnosis not present

## 2023-11-01 DIAGNOSIS — M25569 Pain in unspecified knee: Secondary | ICD-10-CM | POA: Diagnosis not present

## 2023-11-02 NOTE — Discharge Summary (Signed)
Patient ID: Juan Butler MRN: [id number] DOB/AGE: [birth year] 78 y.o.  Admit date: 10/19/2023 Discharge date: 10/20/2023  Admission Diagnoses:  Left knee osteoarthritis  Discharge Diagnoses:  Principal Problem:   S/P total knee replacement, left Active Problems:   S/P total knee arthroplasty, left   Past Medical History:  Diagnosis Date   A-fib [facility])    Anxiety    Arthritis    Dysrhythmia    Enlarged prostate    Hypertension     Surgeries: Procedure(s): TOTAL KNEE ARTHROPLASTY on 10/19/2023   Consultants:   Discharged Condition: Improved  Hospital Course: Juan Butler is an 78 y.o. male who was admitted 10/19/2023 for operative treatment ofS/P total knee replacement, left. Patient has severe unremitting pain that affects sleep, daily activities, and work/hobbies. After pre-op clearance the patient was taken to the operating room on 10/19/2023 and underwent  Procedure(s): TOTAL KNEE ARTHROPLASTY.    Patient was given perioperative antibiotics:  Anti-infectives (From admission, onward)    Start     Dose/Rate Route Frequency Ordered Stop   10/19/23 1600  ceFAZolin (ANCEF) IVPB 2g/100 mL premix        2 g 200 mL/hr over 30 Minutes Intravenous Every 6 hours 10/19/23 1402 10/19/23 2210   10/19/23 0745  ceFAZolin (ANCEF) IVPB 2g/100 mL premix        2 g 200 mL/hr over 30 Minutes Intravenous On call to O.R. 10/19/23 0733 10/19/23 1031        Patient was given sequential compression devices, early ambulation, and chemoprophylaxis to prevent DVT. Patient worked with PT and was meeting their goals regarding safe ambulation and transfers.  Patient benefited maximally from hospital stay and there were no complications.    Recent vital signs: No data found.   Recent laboratory studies: No results for input(s): "WBC", "HGB", "HCT", "PLT", "NA", "K", "CL", "CO2", "BUN", "CREATININE", "GLUCOSE", "INR", "CALCIUM" in the last 72 hours.  Invalid input(s): "PT", "2"   Discharge  Medications:   Allergies as of 10/20/2023       Reactions   Codeine    Severe constipation         Medication List     TAKE these medications    apixaban 5 MG Tabs tablet Commonly known as: Eliquis Take 1 tablet (5 mg total) by mouth 2 (two) times daily.   chlorhexidine 4 % external liquid Commonly known as: HIBICLENS Apply 15 mLs (1 Application total) topically as directed for 30 doses. Use as directed daily for 5 days every other week for 6 weeks.   diazepam 5 MG tablet Commonly known as: VALIUM Take 5 mg by mouth daily as needed for anxiety.   diltiazem 120 MG 24 hr capsule Commonly known as: CARDIZEM CD TAKE 1 CAPSULE BY MOUTH EVERY DAY   fluticasone 0.05 % cream Commonly known as: CUTIVATE Apply 1 Application topically daily as needed (psoriasis).   methocarbamol 500 MG tablet Commonly known as: ROBAXIN Take 1 tablet (500 mg total) by mouth every 6 (six) hours as needed for muscle spasms.   multivitamin tablet Take 1 tablet by mouth daily.   mupirocin ointment 2 % Commonly known as: BACTROBAN Place 1 Application into the nose 2 (two) times daily for 60 doses. Use as directed 2 times daily for 5 days every other week for 6 weeks.   oxyCODONE 5 MG immediate release tablet Commonly known as: Oxy IR/ROXICODONE Take 1 tablet (5 mg total) by mouth every 4 (four) hours as needed for severe pain (  pain score 7-10).   polyethylene glycol 17 g packet Commonly known as: MIRALAX / GLYCOLAX Take 17 g by mouth 2 (two) times daily.   PreserVision AREDS 2 Caps Take 1 capsule by mouth 2 (two) times daily.   senna 8.6 MG Tabs tablet Commonly known as: SENOKOT Take 2 tablets (17.2 mg total) by mouth at bedtime.   tamsulosin 0.4 MG Caps capsule Commonly known as: FLOMAX Take 0.4 mg by mouth daily.               Discharge Care Instructions  (From admission, onward)           Start     Ordered   10/20/23 0000  Change dressing       Comments: Maintain  surgical dressing until follow up in the clinic. If the edges start to pull up, may reinforce with tape. If the dressing is no longer working, may remove and cover with gauze and tape, but must keep the area dry and clean.  Call with any questions or concerns.   10/20/23 0743            Diagnostic Studies: No results found.  Disposition: Discharge disposition: 01-Home or Self Care       Discharge Instructions     Call MD / Call 911   Complete by: As directed    If you experience chest pain or shortness of breath, CALL 911 and be transported to the hospital emergency room.  If you develope a fever above 101 F, pus (white drainage) or increased drainage or redness at the wound, or calf pain, call your surgeon's office.   Change dressing   Complete by: As directed    Maintain surgical dressing until follow up in the clinic. If the edges start to pull up, may reinforce with tape. If the dressing is no longer working, may remove and cover with gauze and tape, but must keep the area dry and clean.  Call with any questions or concerns.   Constipation Prevention   Complete by: As directed    Drink plenty of fluids.  Prune juice may be helpful.  You may use a stool softener, such as Colace (over the counter) 100 mg twice a day.  Use MiraLax (over the counter) for constipation as needed.   Diet - low sodium heart healthy   Complete by: As directed    Increase activity slowly as tolerated   Complete by: As directed    Weight bearing as tolerated with assist device (walker, cane, etc) as directed, use it as long as suggested by your surgeon or therapist, typically at least 4-6 weeks.   Post-operative opioid taper instructions:   Complete by: As directed    POST-OPERATIVE OPIOID TAPER INSTRUCTIONS: It is important to wean off of your opioid medication as soon as possible. If you do not need pain medication after your surgery it is ok to stop day one. Opioids include: Codeine,  Hydrocodone(Norco, Vicodin), Oxycodone(Percocet, oxycontin) and hydromorphone amongst others.  Long term and even short term use of opiods can cause: Increased pain response Dependence Constipation Depression Respiratory depression And more.  Withdrawal symptoms can include Flu like symptoms Nausea, vomiting And more Techniques to manage these symptoms Hydrate well Eat regular healthy meals Stay active Use relaxation techniques(deep breathing, meditating, yoga) Do Not substitute Alcohol to help with tapering If you have been on opioids for less than two weeks and do not have pain than it is ok to stop all together.  Plan to wean off of opioids This plan should start within one week post op of your joint replacement. Maintain the same interval or time between taking each dose and first decrease the dose.  Cut the total daily intake of opioids by one tablet each day Next start to increase the time between doses. The last dose that should be eliminated is the evening dose.      TED hose   Complete by: As directed    Use stockings (TED hose) for 2 weeks on both leg(s).  You may remove them at night for sleeping.        Follow-up Information     Durene Romans, MD. Go on 11/03/2023.   Specialty: Orthopedic Surgery Why: You are scheduled for first post op appt on Wednesday February 5 at 2:45pm. Contact information: [address] [place] [address] [phone]                  Signed: Cassandria Anger 11/02/2023, 10:03 AM

## 2023-11-03 DIAGNOSIS — M25569 Pain in unspecified knee: Secondary | ICD-10-CM | POA: Diagnosis not present

## 2023-11-08 DIAGNOSIS — M25569 Pain in unspecified knee: Secondary | ICD-10-CM | POA: Diagnosis not present

## 2023-11-10 DIAGNOSIS — M25569 Pain in unspecified knee: Secondary | ICD-10-CM | POA: Diagnosis not present

## 2023-11-13 DIAGNOSIS — M25569 Pain in unspecified knee: Secondary | ICD-10-CM | POA: Diagnosis not present

## 2023-11-15 DIAGNOSIS — M25569 Pain in unspecified knee: Secondary | ICD-10-CM | POA: Diagnosis not present

## 2023-11-17 DIAGNOSIS — M25569 Pain in unspecified knee: Secondary | ICD-10-CM | POA: Diagnosis not present

## 2023-11-18 ENCOUNTER — Ambulatory Visit (HOSPITAL_COMMUNITY): Payer: Medicare Other | Admitting: Physician Assistant

## 2023-11-22 DIAGNOSIS — M25569 Pain in unspecified knee: Secondary | ICD-10-CM | POA: Diagnosis not present

## 2023-11-24 DIAGNOSIS — M25569 Pain in unspecified knee: Secondary | ICD-10-CM | POA: Diagnosis not present

## 2023-11-29 DIAGNOSIS — M25569 Pain in unspecified knee: Secondary | ICD-10-CM | POA: Diagnosis not present

## 2023-12-01 DIAGNOSIS — M25569 Pain in unspecified knee: Secondary | ICD-10-CM | POA: Diagnosis not present

## 2023-12-02 DIAGNOSIS — Z4731 Aftercare following explantation of shoulder joint prosthesis: Secondary | ICD-10-CM | POA: Diagnosis not present

## 2023-12-06 ENCOUNTER — Encounter (HOSPITAL_COMMUNITY): Payer: Self-pay | Admitting: Physician Assistant

## 2023-12-06 ENCOUNTER — Ambulatory Visit (HOSPITAL_COMMUNITY)
Admission: RE | Admit: 2023-12-06 | Discharge: 2023-12-06 | Disposition: A | Discharge status: Home / Self Care | Payer: Medicare Other | Source: Ambulatory Visit | Attending: Physician Assistant | Admitting: Physician Assistant

## 2023-12-06 VITALS — BP 142/70 | HR 80 | Ht 69.0 in | Wt 160.0 lb

## 2023-12-06 DIAGNOSIS — M25569 Pain in unspecified knee: Secondary | ICD-10-CM | POA: Diagnosis not present

## 2023-12-06 DIAGNOSIS — I480 Paroxysmal atrial fibrillation: Secondary | ICD-10-CM | POA: Diagnosis not present

## 2023-12-06 DIAGNOSIS — H40013 Open angle with borderline findings, low risk, bilateral: Secondary | ICD-10-CM | POA: Diagnosis not present

## 2023-12-06 DIAGNOSIS — D6869 Other thrombophilia: Secondary | ICD-10-CM | POA: Diagnosis not present

## 2023-12-06 DIAGNOSIS — Z79899 Other long term (current) drug therapy: Secondary | ICD-10-CM | POA: Insufficient documentation

## 2023-12-06 DIAGNOSIS — Z7901 Long term (current) use of anticoagulants: Secondary | ICD-10-CM | POA: Diagnosis not present

## 2023-12-06 DIAGNOSIS — I10 Essential (primary) hypertension: Secondary | ICD-10-CM | POA: Diagnosis not present

## 2023-12-06 DIAGNOSIS — H353133 Nonexudative age-related macular degeneration, bilateral, advanced atrophic without subfoveal involvement: Secondary | ICD-10-CM | POA: Diagnosis not present

## 2023-12-06 DIAGNOSIS — H04123 Dry eye syndrome of bilateral lacrimal glands: Secondary | ICD-10-CM | POA: Diagnosis not present

## 2023-12-06 DIAGNOSIS — H524 Presbyopia: Secondary | ICD-10-CM | POA: Diagnosis not present

## 2023-12-06 MED ORDER — APIXABAN 5 MG PO TABS: 5.0000 mg | ORAL_TABLET | Freq: Two times a day (BID) | ORAL | 3 refills | Status: AC

## 2023-12-06 NOTE — Progress Notes (Signed)
 Primary Care Physician: Georgann Housekeeper, MD Primary Cardiologist: none Primary Electrophysiologist: none Referring Physician: Dr Virl Diamond is a 78 y.o. male with a history of HTN, anxiety, atrial fibrillation who presents for follow up in the [facility].  The patient was initially diagnosed with atrial fibrillation 06/03/22 after presenting to the ED with symptoms of palpitations and lightheadedness. 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 ECG showed rapid afib and he was started on diltiazem drip which eventually converted him to SR after several hours. Patient was started on Eliquis for a CHADS2VASC score of 3 and metoprolol for rate control. Echo showed EF 70-75%, no valvular issues. In hindsight, patient may have had brief episodes of afib prior to this that he attributed to anxiety.   Patient returns for follow up for atrial fibrillation. He denies any interim symptoms of afib. No bleeding issues on anticoagulation. His Kardia mobile has shown only SR.   Today, he denies symptoms of palpitations, chest pain, shortness of breath, orthopnea, PND, lower extremity edema, dizziness, presyncope, syncope, snoring, daytime somnolence, bleeding, or neurologic sequela. The patient is tolerating medications without difficulties and is otherwise without complaint today.    Atrial Fibrillation Risk Factors:  he does not have symptoms or diagnosis of sleep apnea. he does not have a history of rheumatic fever.   Atrial Fibrillation Management history:  Previous antiarrhythmic drugs: none Previous cardioversions: none Previous ablations: none Anticoagulation history: Eliquis   Past Medical History:  Diagnosis Date   A-fib (HCC)    Anxiety    Arthritis    Dysrhythmia    Enlarged prostate    Hypertension     Current Outpatient Medications  Medication Sig Dispense Refill   chlorhexidine (HIBICLENS) 4 % external  liquid Apply 15 mLs (1 Application total) topically as directed for 30 doses. Use as directed daily for 5 days every other week for 6 weeks. 946 mL 1   diazepam (VALIUM) 5 MG tablet Take 5 mg by mouth daily as needed for anxiety.     diltiazem (CARDIZEM CD) 120 MG 24 hr capsule TAKE 1 CAPSULE BY MOUTH EVERY DAY 90 capsule 1   fluticasone (CUTIVATE) 0.05 % cream Apply 1 Application topically daily as needed (psoriasis).     methocarbamol (ROBAXIN) 500 MG tablet Take 1 tablet (500 mg total) by mouth every 6 (six) hours as needed for muscle spasms. 40 tablet 2   Multiple Vitamin (MULTIVITAMIN) tablet Take 1 tablet by mouth daily.     Multiple Vitamins-Minerals (PRESERVISION AREDS 2) CAPS Take 1 capsule by mouth 2 (two) times daily.     oxyCODONE (OXY IR/ROXICODONE) 5 MG immediate release tablet Take 1 tablet (5 mg total) by mouth every 4 (four) hours as needed for severe pain (pain score 7-10). 42 tablet 0   tamsulosin (FLOMAX) 0.4 MG CAPS capsule Take 0.4 mg by mouth daily.     apixaban (ELIQUIS) 5 MG TABS tablet Take 1 tablet (5 mg total) by mouth 2 (two) times daily. 180 tablet 3   No current facility-administered medications for this encounter.    ROS- All systems are reviewed and negative except as per the HPI above.  Physical Exam: Vitals:   12/06/23 1030  BP: (!) 142/70  Pulse: 80  Weight: 72.6 kg  Height: 5\' 9"  (1.753 m)    GEN: Well nourished, well developed in no acute distress CARDIAC: Regular rate and rhythm, no murmurs,  rubs, gallops RESPIRATORY:  Clear to auscultation without rales, wheezing or rhonchi  ABDOMEN: Soft, non-tender, non-distended EXTREMITIES:  No edema; No deformity    Wt Readings from Last 3 Encounters:  12/06/23 72.6 kg  10/19/23 74.8 kg  10/11/23 74.8 kg    EKG today demonstrates  SR Vent. rate 80 BPM PR interval 166 ms QRS duration 94 ms QT/QTcB 374/431 ms   Echo 06/04/22 demonstrated   1. Left ventricular ejection fraction, by estimation, is  70 to 75%. The  left ventricle has hyperdynamic function. The left ventricle has no  regional wall motion abnormalities. Left ventricular diastolic parameters  are consistent with Grade I diastolic dysfunction (impaired relaxation).   2. Right ventricular systolic function is normal. The right ventricular  size is normal.   3. The mitral valve is normal in structure. No evidence of mitral valve  regurgitation. No evidence of mitral stenosis.   4. The aortic valve is tricuspid. Aortic valve regurgitation is not  visualized. No aortic stenosis is present.   5. The inferior vena cava is normal in size with greater than 50%  respiratory variability, suggesting right atrial pressure of 3 mmHg.   Comparison(s): No prior Echocardiogram.  Epic records are reviewed at length today  CHA2DS2-VASc Score = 3  The patient's score is based upon: CHF History: 0 HTN History: 1 Diabetes History: 0 Stroke History: 0 Vascular Disease History: 0 Age Score: 2 Gender Score: 0       ASSESSMENT AND PLAN: Paroxysmal Atrial Fibrillation (ICD10:  I48.0) The patient's CHA2DS2-VASc score is 3, indicating a 3.2% annual risk of stroke.   Patient appears to be maintaining SR. Continue Eliquis 5 mg BID Continue diltiazem 120 mg daily Apple Watch and Kardia mobile for home monitoring.   Secondary Hypercoagulable State (ICD10:  D68.69) The patient is at significant risk for stroke/thromboembolism based upon his CHA2DS2-VASc Score of 3.  Continue Apixaban (Eliquis). No bleeding issues.   HTN Stable on current regimen   Follow up in the AF clinic in one year.    Jorja Loa PA-C Afib [facility] [address] [place], [place] [address] [phone] 12/06/2023 10:37 AM

## 2023-12-13 DIAGNOSIS — M25569 Pain in unspecified knee: Secondary | ICD-10-CM | POA: Diagnosis not present

## 2023-12-14 DIAGNOSIS — M9905 Segmental and somatic dysfunction of pelvic region: Secondary | ICD-10-CM | POA: Diagnosis not present

## 2023-12-14 DIAGNOSIS — M9904 Segmental and somatic dysfunction of sacral region: Secondary | ICD-10-CM | POA: Diagnosis not present

## 2023-12-14 DIAGNOSIS — M9903 Segmental and somatic dysfunction of lumbar region: Secondary | ICD-10-CM | POA: Diagnosis not present

## 2023-12-14 DIAGNOSIS — M51361 Other intervertebral disc degeneration, lumbar region with lower extremity pain only: Secondary | ICD-10-CM | POA: Diagnosis not present

## 2023-12-20 DIAGNOSIS — M25569 Pain in unspecified knee: Secondary | ICD-10-CM | POA: Diagnosis not present

## 2023-12-24 DIAGNOSIS — M25569 Pain in unspecified knee: Secondary | ICD-10-CM | POA: Diagnosis not present

## 2023-12-28 DIAGNOSIS — M25569 Pain in unspecified knee: Secondary | ICD-10-CM | POA: Diagnosis not present

## 2023-12-29 DIAGNOSIS — M9905 Segmental and somatic dysfunction of pelvic region: Secondary | ICD-10-CM | POA: Diagnosis not present

## 2023-12-29 DIAGNOSIS — M9903 Segmental and somatic dysfunction of lumbar region: Secondary | ICD-10-CM | POA: Diagnosis not present

## 2023-12-29 DIAGNOSIS — M9904 Segmental and somatic dysfunction of sacral region: Secondary | ICD-10-CM | POA: Diagnosis not present

## 2023-12-29 DIAGNOSIS — M51361 Other intervertebral disc degeneration, lumbar region with lower extremity pain only: Secondary | ICD-10-CM | POA: Diagnosis not present

## 2023-12-31 ENCOUNTER — Ambulatory Visit: Admitting: Podiatry

## 2023-12-31 ENCOUNTER — Encounter: Payer: Self-pay | Admitting: Podiatry

## 2023-12-31 DIAGNOSIS — M79675 Pain in left toe(s): Secondary | ICD-10-CM | POA: Diagnosis not present

## 2023-12-31 DIAGNOSIS — L608 Other nail disorders: Secondary | ICD-10-CM | POA: Insufficient documentation

## 2023-12-31 DIAGNOSIS — B351 Tinea unguium: Secondary | ICD-10-CM | POA: Insufficient documentation

## 2023-12-31 NOTE — Progress Notes (Signed)
 This patient returns to my office for at risk foot care.  This patient requires this care by a professional since this patient will be at risk due to having coagulation defect due to taking eliquis.  He says he injured his three nails on left foot previously.  Dr.  Charlsie Merles removed his left great toenail.  The 2 and 3 toenails have grown thick and are painful. This patient is unable to cut nails himself since the patient cannot reach his nails.These nails are painful walking and wearing shoes.  This patient presents for at risk foot care today.  General Appearance  Alert, conversant and in no acute stress.  Vascular  Dorsalis pedis and posterior tibial  pulses are palpable  bilaterally.  Capillary return is within normal limits  bilaterally. Temperature is within normal limits  bilaterally.  Neurologic  Senn-Weinstein monofilament wire test within normal limits  bilaterally. Muscle power within normal limits bilaterally.  Nails Thick disfigured discolored nails with subungual debris  from hallux to fifth toes bilaterally. No evidence of bacterial infection or drainage bilaterally. Absent left hallux nail.  Orthopedic  No limitations of motion  feet .  No crepitus or effusions noted.  No bony pathology or digital deformities noted.  Skin  normotropic skin with no porokeratosis noted bilaterally.  No signs of infections or ulcers noted.     Onychomycosis  Pain in right toes  Pain in left toes  Consent was obtained for treatment procedures.   Mechanical debridement of nails 1-5  bilaterally performed with a nail nipper.  Filed with dremel without incident.    Return office visit      4 months                Told patient to return for periodic foot care and evaluation due to potential at risk complications.   Helane Gunther DPM

## 2024-01-03 DIAGNOSIS — M25569 Pain in unspecified knee: Secondary | ICD-10-CM | POA: Diagnosis not present

## 2024-01-04 DIAGNOSIS — R3912 Poor urinary stream: Secondary | ICD-10-CM | POA: Diagnosis not present

## 2024-01-05 ENCOUNTER — Encounter (HOSPITAL_COMMUNITY): Payer: Self-pay | Admitting: Anesthesiology

## 2024-01-05 ENCOUNTER — Ambulatory Visit (HOSPITAL_COMMUNITY)
Admission: RE | Admit: 2024-01-05 | Discharge: 2024-01-05 | Disposition: A | Discharge status: Home / Self Care | Source: Ambulatory Visit | Attending: Internal Medicine | Admitting: Internal Medicine

## 2024-01-05 VITALS — BP 150/64 | HR 159 | Ht 69.0 in | Wt 163.6 lb

## 2024-01-05 DIAGNOSIS — I10 Essential (primary) hypertension: Secondary | ICD-10-CM | POA: Insufficient documentation

## 2024-01-05 DIAGNOSIS — Z7901 Long term (current) use of anticoagulants: Secondary | ICD-10-CM | POA: Diagnosis not present

## 2024-01-05 DIAGNOSIS — D6869 Other thrombophilia: Secondary | ICD-10-CM | POA: Diagnosis not present

## 2024-01-05 DIAGNOSIS — Z79899 Other long term (current) drug therapy: Secondary | ICD-10-CM | POA: Diagnosis not present

## 2024-01-05 DIAGNOSIS — I4819 Other persistent atrial fibrillation: Secondary | ICD-10-CM

## 2024-01-05 DIAGNOSIS — I480 Paroxysmal atrial fibrillation: Secondary | ICD-10-CM | POA: Insufficient documentation

## 2024-01-05 LAB — BASIC METABOLIC PANEL WITH GFR
Anion gap: 15 (ref 5–15)
BUN: 15 mg/dL (ref 8–23)
CO2: 20 mmol/L — ABNORMAL LOW (ref 22–32)
Calcium: 9.8 mg/dL (ref 8.9–10.3)
Chloride: 103 mmol/L (ref 98–111)
Creatinine, Ser: 0.97 mg/dL (ref 0.61–1.24)
GFR, Estimated: >60 mL/min (ref >60)
Glucose, Bld: 90 mg/dL (ref 70–99)
Potassium: 3.8 mmol/L (ref 3.5–5.1)
Sodium: 138 mmol/L (ref 135–145)

## 2024-01-05 LAB — CBC
HCT: 44.8 % (ref 39.0–52.0)
Hemoglobin: 14.7 g/dL (ref 13.0–17.0)
MCH: 29.6 pg (ref 26.0–34.0)
MCHC: 32.8 g/dL (ref 30.0–36.0)
MCV: 90.1 fL (ref 80.0–100.0)
Platelets: 236 10*3/uL (ref 150–400)
RBC: 4.97 MIL/uL (ref 4.22–5.81)
RDW: 13.8 % (ref 11.5–15.5)
WBC: 6.6 10*3/uL (ref 4.0–10.5)
nRBC: 0 % (ref 0.0–0.2)

## 2024-01-05 NOTE — Progress Notes (Signed)
 Called patient with pre-procedure instructions for tomorrow.   Patient informed of:   Time to arrive for procedure. 1230 Remain NPO past midnight.  Must have a ride home and a responsible adult to remain with them for 24 hours post procedure.  Confirmed blood thinner. Eliquis Confirmed no breaks in taking blood thinner for 3+ weeks prior to procedure. Confirmed patient stopped all GLP-1s and GLP-2s for at least one week before procedure.

## 2024-01-05 NOTE — Progress Notes (Signed)
 Primary Care Physician: Georgann Housekeeper, MD Primary Cardiologist: none Primary Electrophysiologist: none Referring Physician: Dr Virl Diamond is a 78 y.o. male with a history of HTN, anxiety, atrial fibrillation who presents for follow up in the [facility].  The patient was initially diagnosed with atrial fibrillation 06/03/22 after presenting to the ED with symptoms of palpitations and lightheadedness. 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 ECG showed rapid afib and he was started on diltiazem drip which eventually converted him to SR after several hours. Patient was started on Eliquis for a CHADS2VASC score of 3 and metoprolol for rate control. Echo showed EF 70-75%, no valvular issues. In hindsight, patient may have had brief episodes of afib prior to this that he attributed to anxiety.   On follow up 01/05/24, he is in Afib with RVR. 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 He denies chest pain or presyncope. He wears an Apple watch and it alerted him to Afib. He also has a Radiographer, therapeutic device at home. No missed doses of Eliquis.   Today, he denies symptoms of palpitations, shortness of breath, orthopnea, PND, lower extremity edema, dizziness, snoring, daytime somnolence, bleeding, or neurologic sequela. The patient is tolerating medications without difficulties and is otherwise without complaint today.    Atrial Fibrillation Risk Factors:  he does not have symptoms or diagnosis of sleep apnea. he does not have a history of rheumatic fever.   Atrial Fibrillation Management history:  Previous antiarrhythmic drugs: none Previous cardioversions: none Previous ablations: none Anticoagulation history: Eliquis   Past Medical History:  Diagnosis Date   A-fib (HCC)    Anxiety    Arthritis    Dysrhythmia    Enlarged prostate    Hypertension     Current  Outpatient Medications  Medication Sig Dispense Refill   apixaban (ELIQUIS) 5 MG TABS tablet Take 1 tablet (5 mg total) by mouth 2 (two) times daily. 180 tablet 3   diazepam (VALIUM) 5 MG tablet Take 5 mg by mouth daily as needed for anxiety.     diltiazem (CARDIZEM CD) 120 MG 24 hr capsule TAKE 1 CAPSULE BY MOUTH EVERY DAY 90 capsule 1   fluticasone (CUTIVATE) 0.05 % cream Apply 1 Application topically daily as needed (psoriasis).     Multiple Vitamin (MULTIVITAMIN) tablet Take 1 tablet by mouth daily.     Multiple Vitamins-Minerals (PRESERVISION AREDS 2) CAPS Take 1 capsule by mouth 2 (two) times daily.     tamsulosin (FLOMAX) 0.4 MG CAPS capsule Take 0.4 mg by mouth 2 (two) times daily.     No current facility-administered medications for this encounter.    ROS- All systems are reviewed and negative except as per the HPI above.  Physical Exam: Vitals:   01/05/24 1428  BP: (!) 150/64  Pulse: (!) 159  Weight: 74.2 kg  Height: 5\' 9"  (1.753 m)    GEN- The patient is well appearing, alert and oriented x 3 today.   Neck - no JVD or carotid bruit noted Lungs- Clear to ausculation bilaterally, normal work of breathing Heart- Irregular tachycardic rate and rhythm, no murmurs, rubs or gallops, PMI not laterally displaced Extremities- no clubbing, cyanosis, or edema Skin - no rash or ecchymosis noted   Wt Readings from Last 3 Encounters:  01/05/24 74.2 kg  12/06/23 72.6 kg  10/19/23 74.8 kg    EKG  today demonstrates  Vent. rate 159 BPM PR interval * ms QRS duration 88 ms QT/QTcB 298/484 ms P-R-T axes * 65 79 Atrial fibrillation with rapid ventricular response ST depression, consider subendocardial injury Abnormal ECG When compared with ECG of 06-Dec-2023 10:32, PREVIOUS ECG IS PRESENT   Echo 06/04/22 demonstrated   1. Left ventricular ejection fraction, by estimation, is 70 to 75%. The  left ventricle has hyperdynamic function. The left ventricle has no  regional wall  motion abnormalities. Left ventricular diastolic parameters  are consistent with Grade I diastolic dysfunction (impaired relaxation).   2. Right ventricular systolic function is normal. The right ventricular  size is normal.   3. The mitral valve is normal in structure. No evidence of mitral valve  regurgitation. No evidence of mitral stenosis.   4. The aortic valve is tricuspid. Aortic valve regurgitation is not  visualized. No aortic stenosis is present.   5. The inferior vena cava is normal in size with greater than 50%  respiratory variability, suggesting right atrial pressure of 3 mmHg.   Comparison(s): No prior Echocardiogram.  Epic records are reviewed at length today  CHA2DS2-VASc Score = 3  The patient's score is based upon: CHF History: 0 HTN History: 1 Diabetes History: 0 Stroke History: 0 Vascular Disease History: 0 Age Score: 2 Gender Score: 0       ASSESSMENT AND PLAN: Paroxysmal Atrial Fibrillation (ICD10:  I48.0) The patient's CHA2DS2-VASc score is 3, indicating a 3.2% annual risk of stroke.    He is in Afib with RVR. He has not missed any doses of Eliquis. We discussed the procedure cardioversion to try to convert to NSR. We discussed the risks vs benefits of this procedure and how ultimately we cannot predict whether a patient will have early return of arrhythmia post procedure. After discussion, the patient wishes to proceed with cardioversion. Labs drawn today. Advised to take additional diltiazem 120 mg this evening. He is scheduled for DCCV tomorrow.   Informed Consent   Shared Decision Making/Informed Consent The risks (stroke, cardiac arrhythmias rarely resulting in the need for a temporary or permanent pacemaker, skin irritation or burns and complications associated with conscious sedation including aspiration, arrhythmia, respiratory failure and death), benefits (restoration of normal sinus rhythm) and alternatives of a direct current cardioversion were  explained in detail to Mr. Sturgeon and he agrees to proceed.      Secondary Hypercoagulable State (ICD10:  D68.69) The patient is at significant risk for stroke/thromboembolism based upon his CHA2DS2-VASc Score of 3.  Continue Apixaban (Eliquis).  No missed doses.   HTN Elevated today while in Afib with RVR. Will trend once back in NSR.    Follow up 2 weeks after DCCV.   Justin Mend, PA-C Afib Clinic [facility] [address] [place], [place] [address] [phone] 01/05/2024 3:11 PM

## 2024-01-05 NOTE — Patient Instructions (Addendum)
 Take an extra cardizem tonight only    Cardioversion scheduled for: Thursday, April 10th    - Arrive at the [facility] and go to admitting at 1230pm   - Do not eat or drink anything after midnight the night prior to your procedure.   - Take all your morning medication (except diabetic medications) with a sip of water prior to arrival.  - You will not be able to drive home after your procedure.    - Do NOT miss any doses of your blood thinner - if you should miss a dose please notify our office immediately.   - If you feel as if you go back into normal rhythm prior to scheduled cardioversion, please notify our office immediately.   If your procedure is canceled in the cardioversion suite you will be charged a cancellation fee.

## 2024-01-06 ENCOUNTER — Encounter (HOSPITAL_COMMUNITY): Admission: RE | Payer: Self-pay | Source: Home / Self Care

## 2024-01-06 ENCOUNTER — Ambulatory Visit (HOSPITAL_COMMUNITY): Admission: RE | Admit: 2024-01-06 | Source: Home / Self Care | Admitting: Cardiology

## 2024-01-06 ENCOUNTER — Telehealth (HOSPITAL_COMMUNITY): Payer: Self-pay | Admitting: *Deleted

## 2024-01-06 DIAGNOSIS — I4891 Unspecified atrial fibrillation: Secondary | ICD-10-CM

## 2024-01-06 SURGERY — CARDIOVERSION (CATH LAB)
Anesthesia: General

## 2024-01-06 NOTE — Telephone Encounter (Signed)
 Patient converted to NSR after extra dose of cardizem last evening. HR currently 73 according to kardia device. Feeling back to baseline. DCCV canceled per Landry Mellow PA. Keep scheduled follow up. Pt in agreement will call if issues.

## 2024-01-10 DIAGNOSIS — M25569 Pain in unspecified knee: Secondary | ICD-10-CM | POA: Diagnosis not present

## 2024-01-17 DIAGNOSIS — M25569 Pain in unspecified knee: Secondary | ICD-10-CM | POA: Diagnosis not present

## 2024-01-19 DIAGNOSIS — M9903 Segmental and somatic dysfunction of lumbar region: Secondary | ICD-10-CM | POA: Diagnosis not present

## 2024-01-19 DIAGNOSIS — M51361 Other intervertebral disc degeneration, lumbar region with lower extremity pain only: Secondary | ICD-10-CM | POA: Diagnosis not present

## 2024-01-19 DIAGNOSIS — M9905 Segmental and somatic dysfunction of pelvic region: Secondary | ICD-10-CM | POA: Diagnosis not present

## 2024-01-19 DIAGNOSIS — M9904 Segmental and somatic dysfunction of sacral region: Secondary | ICD-10-CM | POA: Diagnosis not present

## 2024-01-24 DIAGNOSIS — M25569 Pain in unspecified knee: Secondary | ICD-10-CM | POA: Diagnosis not present

## 2024-01-27 ENCOUNTER — Ambulatory Visit (HOSPITAL_COMMUNITY): Admitting: Physician Assistant

## 2024-02-01 ENCOUNTER — Encounter (HOSPITAL_COMMUNITY): Payer: Self-pay | Admitting: Physician Assistant

## 2024-02-01 ENCOUNTER — Ambulatory Visit (HOSPITAL_COMMUNITY)
Admission: RE | Admit: 2024-02-01 | Discharge: 2024-02-01 | Disposition: A | Discharge status: Home / Self Care | Source: Ambulatory Visit | Attending: Physician Assistant | Admitting: Physician Assistant

## 2024-02-01 VITALS — BP 138/70 | HR 71 | Ht 69.0 in | Wt 162.2 lb

## 2024-02-01 DIAGNOSIS — D6869 Other thrombophilia: Secondary | ICD-10-CM

## 2024-02-01 DIAGNOSIS — I480 Paroxysmal atrial fibrillation: Secondary | ICD-10-CM

## 2024-02-01 MED ORDER — DILTIAZEM HCL 30 MG PO TABS: ORAL_TABLET | ORAL | 1 refills | Status: AC

## 2024-02-01 NOTE — Progress Notes (Signed)
 Primary Care Physician: Jearldine Mina, MD Primary Cardiologist: none Primary Electrophysiologist: none Referring Physician: Dr Kristan Petit is a 78 y.o. male with a history of HTN, anxiety, atrial fibrillation who presents for follow up in the [facility].  The patient was initially diagnosed with atrial fibrillation 06/03/22 after presenting to the ED with symptoms of palpitations and lightheadedness. 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 ECG showed rapid afib and he was started on diltiazem  drip which eventually converted him to SR after several hours. Patient was started on Eliquis  for stroke prevention and metoprolol  for rate control. Echo showed EF 70-75%, no valvular issues. In hindsight, patient may have had brief episodes of afib prior to this that he attributed to anxiety.   Patient was seen 01/05/24 in afib. He was set up for DCCV but converted to SR with extra dose of diltiazem .   Patient returns for follow up for atrial fibrillation. He reports that he has done well. He denies any interim symptoms of afib. No bleeding issues on anticoagulation.   Today, he  denies symptoms of palpitations, chest pain, shortness of breath, orthopnea, PND, lower extremity edema, dizziness, presyncope, syncope, snoring, daytime somnolence, bleeding, or neurologic sequela. The patient is tolerating medications without difficulties and is otherwise without complaint today.    Atrial Fibrillation Risk Factors:  he does not have symptoms or diagnosis of sleep apnea. he does not have a history of rheumatic fever.   Atrial Fibrillation Management history:  Previous antiarrhythmic drugs: none Previous cardioversions: none Previous ablations: none Anticoagulation history: Eliquis    Past Medical History:  Diagnosis Date   A-fib (HCC)    Anxiety    Arthritis    Dysrhythmia    Enlarged prostate    Hypertension      Current Outpatient Medications  Medication Sig Dispense Refill   apixaban  (ELIQUIS ) 5 MG TABS tablet Take 1 tablet (5 mg total) by mouth 2 (two) times daily. 180 tablet 3   diazepam  (VALIUM ) 5 MG tablet Take 5 mg by mouth daily as needed for anxiety.     diltiazem  (CARDIZEM  CD) 120 MG 24 hr capsule TAKE 1 CAPSULE BY MOUTH EVERY DAY 90 capsule 1   fluticasone (CUTIVATE) 0.05 % cream Apply 1 Application topically daily as needed (psoriasis).     Multiple Vitamin (MULTIVITAMIN) tablet Take 1 tablet by mouth daily.     Multiple Vitamins-Minerals (PRESERVISION AREDS 2) CAPS Take 1 capsule by mouth 2 (two) times daily.     Polyethyl Glycol-Propyl Glycol (SYSTANE) 0.4-0.3 % SOLN Place 1 drop into both eyes daily as needed (Dry eyes).     tamsulosin  (FLOMAX ) 0.4 MG CAPS capsule Take 0.4 mg by mouth 2 (two) times daily.     No current facility-administered medications for this encounter.    ROS- All systems are reviewed and negative except as per the HPI above.  Physical Exam: Vitals:   02/01/24 1455  BP: 138/70  Pulse: 71  Weight: 73.6 kg  Height: 5\' 9"  (1.753 m)    GEN: Well nourished, well developed in no acute distress CARDIAC: Regular rate and rhythm, no murmurs, rubs, gallops RESPIRATORY:  Clear to auscultation without rales, wheezing or rhonchi  ABDOMEN: Soft, non-tender, non-distended EXTREMITIES:  No edema; No deformity    Wt Readings from Last 3 Encounters:  02/01/24 73.6 kg  01/05/24 74.2 kg  12/06/23 72.6 kg    EKG today demonstrates  SR Vent. rate 71 BPM PR interval 170 ms QRS duration 92 ms QT/QTcB 392/425 ms   Echo 06/04/22 demonstrated   1. Left ventricular ejection fraction, by estimation, is 70 to 75%. The  left ventricle has hyperdynamic function. The left ventricle has no  regional wall motion abnormalities. Left ventricular diastolic parameters  are consistent with Grade I diastolic dysfunction (impaired relaxation).   2. Right ventricular systolic  function is normal. The right ventricular  size is normal.   3. The mitral valve is normal in structure. No evidence of mitral valve  regurgitation. No evidence of mitral stenosis.   4. The aortic valve is tricuspid. Aortic valve regurgitation is not  visualized. No aortic stenosis is present.   5. The inferior vena cava is normal in size with greater than 50%  respiratory variability, suggesting right atrial pressure of 3 mmHg.   Comparison(s): No prior Echocardiogram.  Epic records are reviewed at length today  CHA2DS2-VASc Score = 3  The patient's score is based upon: CHF History: 0 HTN History: 1 Diabetes History: 0 Stroke History: 0 Vascular Disease History: 0 Age Score: 2 Gender Score: 0       ASSESSMENT AND PLAN: Paroxysmal Atrial Fibrillation (ICD10:  I48.0) The patient's CHA2DS2-VASc score is 3, indicating a 3.2% annual risk of stroke.   Patient appears to be maintaining SR Continue diltiazem  120 mg daily. Will start diltiazem  30 mg PRN q 4 hours for heart racing. Continue Eliquis  5 mg BID Kardia mobile for home monitoring.    Secondary Hypercoagulable State (ICD10:  D68.69) The patient is at significant risk for stroke/thromboembolism based upon his CHA2DS2-VASc Score of 3.  Continue Apixaban  (Eliquis ). No bleeding issues.   HTN Stable on current regimen   Follow up in the AF clinic in 6 months.    Myrtha Ates PA-C Afib Clinic [facility] [address] [place], [place] [address] [phone] 02/01/2024 3:23 PM

## 2024-02-08 DIAGNOSIS — M25569 Pain in unspecified knee: Secondary | ICD-10-CM | POA: Diagnosis not present

## 2024-02-09 DIAGNOSIS — M9904 Segmental and somatic dysfunction of sacral region: Secondary | ICD-10-CM | POA: Diagnosis not present

## 2024-02-09 DIAGNOSIS — M9905 Segmental and somatic dysfunction of pelvic region: Secondary | ICD-10-CM | POA: Diagnosis not present

## 2024-02-09 DIAGNOSIS — M51361 Other intervertebral disc degeneration, lumbar region with lower extremity pain only: Secondary | ICD-10-CM | POA: Diagnosis not present

## 2024-02-09 DIAGNOSIS — M9903 Segmental and somatic dysfunction of lumbar region: Secondary | ICD-10-CM | POA: Diagnosis not present

## 2024-02-22 DIAGNOSIS — M25569 Pain in unspecified knee: Secondary | ICD-10-CM | POA: Diagnosis not present

## 2024-03-01 DIAGNOSIS — M9905 Segmental and somatic dysfunction of pelvic region: Secondary | ICD-10-CM | POA: Diagnosis not present

## 2024-03-01 DIAGNOSIS — M9904 Segmental and somatic dysfunction of sacral region: Secondary | ICD-10-CM | POA: Diagnosis not present

## 2024-03-01 DIAGNOSIS — M9903 Segmental and somatic dysfunction of lumbar region: Secondary | ICD-10-CM | POA: Diagnosis not present

## 2024-03-01 DIAGNOSIS — M51361 Other intervertebral disc degeneration, lumbar region with lower extremity pain only: Secondary | ICD-10-CM | POA: Diagnosis not present

## 2024-03-07 DIAGNOSIS — R7303 Prediabetes: Secondary | ICD-10-CM | POA: Diagnosis not present

## 2024-03-07 DIAGNOSIS — N182 Chronic kidney disease, stage 2 (mild): Secondary | ICD-10-CM | POA: Diagnosis not present

## 2024-03-07 DIAGNOSIS — I10 Essential (primary) hypertension: Secondary | ICD-10-CM | POA: Diagnosis not present

## 2024-03-07 DIAGNOSIS — M25569 Pain in unspecified knee: Secondary | ICD-10-CM | POA: Diagnosis not present

## 2024-03-07 DIAGNOSIS — G4700 Insomnia, unspecified: Secondary | ICD-10-CM | POA: Diagnosis not present

## 2024-03-07 DIAGNOSIS — I480 Paroxysmal atrial fibrillation: Secondary | ICD-10-CM | POA: Diagnosis not present

## 2024-04-04 DIAGNOSIS — S1086XA Insect bite of other specified part of neck, initial encounter: Secondary | ICD-10-CM | POA: Diagnosis not present

## 2024-04-05 DIAGNOSIS — M9903 Segmental and somatic dysfunction of lumbar region: Secondary | ICD-10-CM | POA: Diagnosis not present

## 2024-04-05 DIAGNOSIS — M9905 Segmental and somatic dysfunction of pelvic region: Secondary | ICD-10-CM | POA: Diagnosis not present

## 2024-04-05 DIAGNOSIS — M51361 Other intervertebral disc degeneration, lumbar region with lower extremity pain only: Secondary | ICD-10-CM | POA: Diagnosis not present

## 2024-04-05 DIAGNOSIS — M9904 Segmental and somatic dysfunction of sacral region: Secondary | ICD-10-CM | POA: Diagnosis not present

## 2024-04-26 DIAGNOSIS — M9903 Segmental and somatic dysfunction of lumbar region: Secondary | ICD-10-CM | POA: Diagnosis not present

## 2024-04-26 DIAGNOSIS — M9905 Segmental and somatic dysfunction of pelvic region: Secondary | ICD-10-CM | POA: Diagnosis not present

## 2024-04-26 DIAGNOSIS — M9904 Segmental and somatic dysfunction of sacral region: Secondary | ICD-10-CM | POA: Diagnosis not present

## 2024-04-26 DIAGNOSIS — M51361 Other intervertebral disc degeneration, lumbar region with lower extremity pain only: Secondary | ICD-10-CM | POA: Diagnosis not present

## 2024-05-02 ENCOUNTER — Encounter: Payer: Self-pay | Admitting: Cardiology

## 2024-05-02 ENCOUNTER — Ambulatory Visit: Attending: Cardiology | Admitting: Cardiology

## 2024-05-02 VITALS — BP 138/56 | HR 62 | Ht 69.0 in | Wt 179.0 lb

## 2024-05-02 DIAGNOSIS — I4891 Unspecified atrial fibrillation: Secondary | ICD-10-CM | POA: Diagnosis not present

## 2024-05-02 DIAGNOSIS — I10 Essential (primary) hypertension: Secondary | ICD-10-CM | POA: Diagnosis not present

## 2024-05-02 DIAGNOSIS — D6869 Other thrombophilia: Secondary | ICD-10-CM | POA: Diagnosis not present

## 2024-05-02 DIAGNOSIS — I480 Paroxysmal atrial fibrillation: Secondary | ICD-10-CM | POA: Diagnosis not present

## 2024-05-02 NOTE — Patient Instructions (Addendum)
 Medication Instructions:   No changes *If you need a refill on your cardiac medications before your next appointment, please call your pharmacy*   Lab Work: Not needed If you have labs (blood work) drawn today and your tests are completely normal, you will receive your results only by: MyChart Message (if you have MyChart) OR A paper copy in the mail If you have any lab test that is abnormal or we need to change your treatment, we will call you to review the results.   Testing/Procedures: Not needed   Follow-Up: At [facility], you and your health needs are our priority.  As part of our continuing mission to provide you with exceptional heart care, we have created designated Provider Care Teams.  These Care Teams include your primary Cardiologist (physician) and Advanced Practice Providers (APPs -  Physician Assistants and Nurse Practitioners) who all work together to provide you with the care you need, when you need it.     Your next appointment:   9 month(s)  The format for your next appointment:   In Person  Provider:   Alm Clay, MD

## 2024-05-02 NOTE — Progress Notes (Signed)
 Cardiology Office Note:  .   Date:  05/12/2024  ID:  Juan, Butler 12-06-1945, MRN [id number] PCP/Referring Provider: Ransom Other, MD  [facility] Providers Cardiologist:  Alm Clay, MD Cardiology APP:  Nellene Quita SAUNDERS, [place]     Chief Complaint  Patient presents with   Follow-up    Primary cardiology follow-up.  Former patient of Dr. Gerlene Passe   Atrial Fibrillation    Maybe 1 more episodes since last visit with primary cardiology 1 year ago-broke with additional diltiazem     Patient Profile: Juan     EDMUND Butler is a very pleasant and healthy-appearing 78 y.o. male with a PMH notable for history of PE, HTN and relatively recent diagnosis of PAF who presents here for annual primary cardiologist follow-up.    PMH:  History of PE  PAF: Initial diagnosis in September 2023: Went to the ER with palpitations developing A-fib RVR.  He felt that he may have had a shorter episode preceding this point.-Originally attributed to anxiety. Has had ~3 episodes of PAF since his original dx of Afib   His last visit with Dr. Aleene Passe was in August 2024: At that time he noted he had 1 episode of PAF since original diagnosis of A-fib that lasted from 6 AM to 5 PM.  No other symptoms or review of symptoms noted.  PAF felt to controlled.  Ablation, Watchman, cardioversion, antiarrhythmics discussed.  Consider converting from metoprolol  to propranolol but decided to continue metoprolol  and Eliquis .    Juan Butler was last seen on Feb 01, 2024 by Mr. Parry, [place] from [place] [facility].  At this visit they noted that April night he was in A-fib, but apparently converted to sinus rhythm with extra dose of diltiazem  prior to going for DCCV.  Was doing well with no issues.  No further symptoms of A-fib.  No bleeding from DOAC.  Subjective  Discussed the use of AI scribe software for clinical note transcription with the patient, who gave verbal consent to proceed.  History of Present  Illness  Juan Butler is an 78 year old male with atrial fibrillation who presents for follow-up.  He has a history of atrial fibrillation, first diagnosed in September 2023, after initially mistaking his symptoms for a panic attack due to anxiety. Since diagnosis, he has experienced three episodes, each lasting from a few hours to a maximum of six hours. The last episode resolved within an hour after taking an additional dose of Cardizem .  He is currently on Eliquis  and Cardizem  for atrial fibrillation management. His baseline dose of Cardizem  is 120 mg daily, with an additional 30 mg as a rescue dose for breakthrough episodes. No bleeding issues while on Eliquis  and no recurrence of atrial fibrillation since his last visit a couple of months ago.  During episodes of atrial fibrillation, he experiences a rapid heart rate and feels as though he is 'running a race' without physical exertion, leading to exhaustion the following day. No chest pain, pressure, or tightness is associated with these episodes.  His echocardiogram in September 2023 showed normal heart function with grade one diastolic dysfunction.  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  Cardiovascular ROS: no chest pain or dyspnea on exertion positive for - rare palpitation symptoms but nothing prolonged negative for - edema, orthopnea, paroxysmal nocturnal dyspnea, rapid heart rate, shortness of breath, or syncope or near syncope, CVA/TIA or amaurosis fugax, claudication Tolerating medication without issues.  Objective   Medications: Diltiazem  CD1 20 mg daily; diltiazem  (short acting) 30 mg every 4 hours as needed A-fib/HR >100 bpm. 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  No RWMA.  GR 1 DD.  Normal RV size and function.  Normal mitral and aortic valves.  Normal RAP based on IVC diameter.  (06/04/2022)  Risk Assessment/Calculations:    CHA2DS2-VASc Score = 3   This indicates a 3.2% annual risk of stroke. The patient's score is based upon: CHF History: 0 HTN History: 1 Diabetes History: 0 Stroke History: 0 Vascular Disease History: 0 Age Score: 2 Gender Score: 0         Physical Exam:   VS:  BP (!) 138/56 (BP Location: Right Arm, Patient Position: Sitting, Cuff Size: Normal)   Pulse 62   Ht 5' 9 (1.753 m)   Wt 179 lb (81.2 kg)   SpO2 97%   BMI 26.43 kg/m    Wt Readings from Last 3 Encounters:  05/02/24 179 lb (81.2 kg)  02/01/24 162 lb 3.2 oz (73.6 kg)  01/05/24 163 lb 9.6 oz (74.2 kg)    GEN: Well nourished, well groomed in no acute distress; healthy-appearing NECK: No JVD; No carotid bruits CARDIAC: Normal S1, S2; RRR, no murmurs, rubs, gallops RESPIRATORY:  Clear to auscultation without rales, wheezing or rhonchi ; nonlabored, good air movement. ABDOMEN: Soft, non-tender, non-distended EXTREMITIES:  No edema; No deformity      ASSESSMENT AND PLAN: .    Problem List Items Addressed This Visit       Cardiology Problems   Hypercoagulable state due to paroxysmal atrial fibrillation (HCC) (Chronic)   Based on age and hypertension.  His chads vascular score of 3.  Likely has some vascular disease as well which would make it 4.  Tolerating Eliquis  5 mg twice daily without any difficulty. -Okay to hold Eliquis  2 to 3 days preop for surgeries or procedures.   Restart when safe postop no need for bridging      Paroxysmal atrial fibrillation (HCC) - Primary (Chronic)   Paroxysmal atrial fibrillation with infrequent episodes, managed effectively with Eliquis  and Cardizem . Echocardiogram normal except for age-appropriate diastolic dysfunction. No heart failure symptoms. Ablation avoidance preferred. - Continue Eliquis . - Continue Cardizem  120 mg for baseline control. - Use Cardizem  30 mg for breakthrough episodes. - Avoid ablation unless necessary.      Primary hypertension (Chronic)   Borderline blood pressure here today on low-dose diltiazem . He will keep an eye on his blood pressures at home.  But I would be relatively reluctant to be overly aggressive treating blood pressure.      Relevant Orders   EKG 12-Lead (Completed)       Follow-Up: Return in about 9 months (around 01/30/2025) for Routine follow up with me, [employer].     Signed,  Alm MICAEL Clay, MD, MS Alm Clay, M.D., M.S. Interventional Chartered certified accountant  Pager # [phone]

## 2024-05-05 ENCOUNTER — Ambulatory Visit: Admitting: Podiatry

## 2024-05-05 ENCOUNTER — Encounter: Payer: Self-pay | Admitting: Podiatry

## 2024-05-05 DIAGNOSIS — K5732 Diverticulitis of large intestine without perforation or abscess without bleeding: Secondary | ICD-10-CM | POA: Insufficient documentation

## 2024-05-05 DIAGNOSIS — I10 Essential (primary) hypertension: Secondary | ICD-10-CM | POA: Insufficient documentation

## 2024-05-05 DIAGNOSIS — L608 Other nail disorders: Secondary | ICD-10-CM | POA: Diagnosis not present

## 2024-05-05 DIAGNOSIS — M79675 Pain in left toe(s): Secondary | ICD-10-CM

## 2024-05-05 DIAGNOSIS — J309 Allergic rhinitis, unspecified: Secondary | ICD-10-CM | POA: Insufficient documentation

## 2024-05-05 DIAGNOSIS — B351 Tinea unguium: Secondary | ICD-10-CM | POA: Diagnosis not present

## 2024-05-05 DIAGNOSIS — Z1210 Encounter for screening for malignant neoplasm of intestinal tract, unspecified: Secondary | ICD-10-CM | POA: Insufficient documentation

## 2024-05-05 DIAGNOSIS — G4700 Insomnia, unspecified: Secondary | ICD-10-CM | POA: Insufficient documentation

## 2024-05-05 DIAGNOSIS — L409 Psoriasis, unspecified: Secondary | ICD-10-CM | POA: Insufficient documentation

## 2024-05-05 DIAGNOSIS — K219 Gastro-esophageal reflux disease without esophagitis: Secondary | ICD-10-CM | POA: Insufficient documentation

## 2024-05-05 DIAGNOSIS — N182 Chronic kidney disease, stage 2 (mild): Secondary | ICD-10-CM | POA: Insufficient documentation

## 2024-05-05 DIAGNOSIS — N400 Enlarged prostate without lower urinary tract symptoms: Secondary | ICD-10-CM | POA: Insufficient documentation

## 2024-05-05 DIAGNOSIS — K5730 Diverticulosis of large intestine without perforation or abscess without bleeding: Secondary | ICD-10-CM | POA: Insufficient documentation

## 2024-05-05 DIAGNOSIS — D6869 Other thrombophilia: Secondary | ICD-10-CM | POA: Insufficient documentation

## 2024-05-05 DIAGNOSIS — R7303 Prediabetes: Secondary | ICD-10-CM | POA: Insufficient documentation

## 2024-05-05 NOTE — Progress Notes (Signed)
 This patient returns to my office for at risk foot care.  This patient requires this care by a professional since this patient will be at risk due to having CKD.  This patient is unable to cut nails himself since the patient cannot reach his nails.These nails are painful walking and wearing shoes.  This patient presents for at risk foot care today.  General Appearance  Alert, conversant and in no acute stress.  Vascular  Dorsalis pedis and posterior tibial  pulses are palpable  bilaterally.  Capillary return is within normal limits  bilaterally. Temperature is within normal limits  bilaterally.  Neurologic  Senn-Weinstein monofilament wire test within normal limits  bilaterally. Muscle power within normal limits bilaterally.  Nails Thick disfigured discolored nails with subungual debris  from hallux to fifth toes bilaterally. Minimal nail noted left hallux.No evidence of bacterial infection or drainage bilaterally.  Orthopedic  No limitations of motion  feet .  No crepitus or effusions noted.  No bony pathology or digital deformities noted.  Skin  normotropic skin with no porokeratosis noted bilaterally.  No signs of infections or ulcers noted.     Onychomycosis  Pain in right toes  Pain in left toes  Consent was obtained for treatment procedures.   Mechanical debridement of nails 1-5  bilaterally performed with a nail nipper.  Filed with dremel without incident.    Return office visit    3 months                  Told patient to return for periodic foot care and evaluation due to potential at risk complications.   Cordella Bold DPM

## 2024-05-11 ENCOUNTER — Encounter: Payer: Self-pay | Admitting: Cardiology

## 2024-05-12 NOTE — Assessment & Plan Note (Signed)
 Paroxysmal atrial fibrillation with infrequent episodes, managed effectively with Eliquis  and Cardizem . Echocardiogram normal except for age-appropriate diastolic dysfunction. No heart failure symptoms. Ablation avoidance preferred. - Continue Eliquis . - Continue Cardizem  120 mg for baseline control. - Use Cardizem  30 mg for breakthrough episodes. - Avoid ablation unless necessary.

## 2024-05-12 NOTE — Assessment & Plan Note (Addendum)
 Based on age and hypertension.  His chads vascular score of 3.  Likely has some vascular disease as well which would make it 4.  Tolerating Eliquis  5 mg twice daily without any difficulty. -Okay to hold Eliquis  2 to 3 days preop for surgeries or procedures.  Restart when safe postop no need for bridging

## 2024-05-12 NOTE — Assessment & Plan Note (Signed)
 Borderline blood pressure here today on low-dose diltiazem . He will keep an eye on his blood pressures at home.  But I would be relatively reluctant to be overly aggressive treating blood pressure.

## 2024-05-16 ENCOUNTER — Telehealth: Payer: Self-pay | Admitting: Cardiology

## 2024-05-16 NOTE — Telephone Encounter (Signed)
 Pt c/o of Chest Pain: STAT if active (IN THIS MOMENT) CP, including tightness, pressure, jaw pain, shoulder/upper arm/back pain, SOB, nausea, and vomiting.  1. Are you having CP right now (tightness, pressure, or discomfort)? Tightness   2. Are you experiencing any other symptoms (ex. SOB, nausea, vomiting, sweating)? no  3. How long have you been experiencing CP? Couple   4. Is your CP continuous or coming and going? Coming and going   5. Have you taken Nitroglycerin? No   6. If CP returns before callback, please consider calling 911. ?

## 2024-05-16 NOTE — Telephone Encounter (Signed)
 Spoke to patient .  He states he is has notice that he has chest  tightness  randomly.  No active or rest causes the  Tightness.   It does not cause shortness of breathe , or no nausea  no radiation  pain  or no diaphoresis per patient.  The tightness last less than minute, per patient.  Juan Butler states that he does have an issue with Anxiety and  when he uses valium   it seems to subside.   RN  informed patient to continue to monitor  symptoms,  if occur several tomes in a day or occurring daily  contact office.  Patient is aware  that message will be sent to Dr Anner for review. Patient verbalized understanding.

## 2024-05-17 NOTE — Telephone Encounter (Signed)
 CP that is random & not made worse with activity is not usually consistent with heart related pain. Also < 1 min usually not angina. Could be short atrial runs.   If gets worse - will need to be seen to assess. DH

## 2024-05-23 DIAGNOSIS — M25569 Pain in unspecified knee: Secondary | ICD-10-CM | POA: Diagnosis not present

## 2024-05-24 DIAGNOSIS — M51361 Other intervertebral disc degeneration, lumbar region with lower extremity pain only: Secondary | ICD-10-CM | POA: Diagnosis not present

## 2024-05-24 DIAGNOSIS — M9904 Segmental and somatic dysfunction of sacral region: Secondary | ICD-10-CM | POA: Diagnosis not present

## 2024-05-24 DIAGNOSIS — M9905 Segmental and somatic dysfunction of pelvic region: Secondary | ICD-10-CM | POA: Diagnosis not present

## 2024-05-24 DIAGNOSIS — M9903 Segmental and somatic dysfunction of lumbar region: Secondary | ICD-10-CM | POA: Diagnosis not present

## 2024-05-30 DIAGNOSIS — L820 Inflamed seborrheic keratosis: Secondary | ICD-10-CM | POA: Diagnosis not present

## 2024-05-30 DIAGNOSIS — L821 Other seborrheic keratosis: Secondary | ICD-10-CM | POA: Diagnosis not present

## 2024-05-30 DIAGNOSIS — D225 Melanocytic nevi of trunk: Secondary | ICD-10-CM | POA: Diagnosis not present

## 2024-05-30 DIAGNOSIS — D1801 Hemangioma of skin and subcutaneous tissue: Secondary | ICD-10-CM | POA: Diagnosis not present

## 2024-06-07 DIAGNOSIS — M9904 Segmental and somatic dysfunction of sacral region: Secondary | ICD-10-CM | POA: Diagnosis not present

## 2024-06-07 DIAGNOSIS — M9905 Segmental and somatic dysfunction of pelvic region: Secondary | ICD-10-CM | POA: Diagnosis not present

## 2024-06-07 DIAGNOSIS — M51361 Other intervertebral disc degeneration, lumbar region with lower extremity pain only: Secondary | ICD-10-CM | POA: Diagnosis not present

## 2024-06-07 DIAGNOSIS — M9903 Segmental and somatic dysfunction of lumbar region: Secondary | ICD-10-CM | POA: Diagnosis not present

## 2024-06-12 DIAGNOSIS — H353133 Nonexudative age-related macular degeneration, bilateral, advanced atrophic without subfoveal involvement: Secondary | ICD-10-CM | POA: Diagnosis not present

## 2024-06-12 DIAGNOSIS — H35363 Drusen (degenerative) of macula, bilateral: Secondary | ICD-10-CM | POA: Diagnosis not present

## 2024-06-20 DIAGNOSIS — M25569 Pain in unspecified knee: Secondary | ICD-10-CM | POA: Diagnosis not present

## 2024-06-26 ENCOUNTER — Other Ambulatory Visit (HOSPITAL_COMMUNITY): Payer: Self-pay | Admitting: *Deleted

## 2024-06-26 MED ORDER — DILTIAZEM HCL ER COATED BEADS 120 MG PO CP24: 120.0000 mg | ORAL_CAPSULE | Freq: Every day | ORAL | 1 refills | Status: AC

## 2024-06-28 DIAGNOSIS — M9905 Segmental and somatic dysfunction of pelvic region: Secondary | ICD-10-CM | POA: Diagnosis not present

## 2024-06-28 DIAGNOSIS — M9904 Segmental and somatic dysfunction of sacral region: Secondary | ICD-10-CM | POA: Diagnosis not present

## 2024-06-28 DIAGNOSIS — M51361 Other intervertebral disc degeneration, lumbar region with lower extremity pain only: Secondary | ICD-10-CM | POA: Diagnosis not present

## 2024-06-28 DIAGNOSIS — M9903 Segmental and somatic dysfunction of lumbar region: Secondary | ICD-10-CM | POA: Diagnosis not present

## 2024-07-19 DIAGNOSIS — M9904 Segmental and somatic dysfunction of sacral region: Secondary | ICD-10-CM | POA: Diagnosis not present

## 2024-07-19 DIAGNOSIS — M9905 Segmental and somatic dysfunction of pelvic region: Secondary | ICD-10-CM | POA: Diagnosis not present

## 2024-07-19 DIAGNOSIS — M51361 Other intervertebral disc degeneration, lumbar region with lower extremity pain only: Secondary | ICD-10-CM | POA: Diagnosis not present

## 2024-07-19 DIAGNOSIS — M9903 Segmental and somatic dysfunction of lumbar region: Secondary | ICD-10-CM | POA: Diagnosis not present

## 2024-08-04 ENCOUNTER — Ambulatory Visit: Admitting: Podiatry

## 2024-08-04 ENCOUNTER — Encounter: Payer: Self-pay | Admitting: Podiatry

## 2024-08-04 DIAGNOSIS — B351 Tinea unguium: Secondary | ICD-10-CM

## 2024-08-04 DIAGNOSIS — M79675 Pain in left toe(s): Secondary | ICD-10-CM

## 2024-08-04 DIAGNOSIS — L608 Other nail disorders: Secondary | ICD-10-CM

## 2024-08-04 DIAGNOSIS — I480 Paroxysmal atrial fibrillation: Secondary | ICD-10-CM

## 2024-08-04 DIAGNOSIS — D6869 Other thrombophilia: Secondary | ICD-10-CM

## 2024-08-04 NOTE — Progress Notes (Signed)
 This patient returns to my office for at risk foot care.  This patient requires this care by a professional since this patient will be at risk due to having CKD.  This patient is unable to cut nails himself since the patient cannot reach his nails.These nails are painful walking and wearing shoes.  This patient presents for at risk foot care today.  General Appearance  Alert, conversant and in no acute stress.  Vascular  Dorsalis pedis and posterior tibial  pulses are palpable  bilaterally.  Capillary return is within normal limits  bilaterally. Temperature is within normal limits  bilaterally.  Neurologic  Senn-Weinstein monofilament wire test within normal limits  bilaterally. Muscle power within normal limits bilaterally.  Nails Thick disfigured discolored nails with subungual debris  from hallux to fifth toes bilaterally. Minimal nail noted left hallux.No evidence of bacterial infection or drainage bilaterally.  Orthopedic  No limitations of motion  feet .  No crepitus or effusions noted.  No bony pathology or digital deformities noted.  Skin  normotropic skin with no porokeratosis noted bilaterally.  No signs of infections or ulcers noted.     Onychomycosis  Pain in right toes  Pain in left toes  Consent was obtained for treatment procedures.   Mechanical debridement of nails 1-5  bilaterally performed with a nail nipper.  Filed with dremel without incident.    Return office visit    4  months                  Told patient to return for periodic foot care and evaluation due to potential at risk complications.   Cordella Bold DPM

## 2024-08-07 ENCOUNTER — Ambulatory Visit (HOSPITAL_COMMUNITY)
Admission: RE | Admit: 2024-08-07 | Discharge: 2024-08-07 | Disposition: A | Discharge status: Home / Self Care | Source: Ambulatory Visit | Attending: Physician Assistant | Admitting: Physician Assistant

## 2024-08-07 VITALS — BP 154/66 | HR 53 | Ht 69.0 in | Wt 165.4 lb

## 2024-08-07 DIAGNOSIS — I480 Paroxysmal atrial fibrillation: Secondary | ICD-10-CM | POA: Diagnosis not present

## 2024-08-07 DIAGNOSIS — I4891 Unspecified atrial fibrillation: Secondary | ICD-10-CM

## 2024-08-07 DIAGNOSIS — D6869 Other thrombophilia: Secondary | ICD-10-CM

## 2024-08-07 NOTE — Progress Notes (Signed)
 Primary Care Physician: Ransom Other, MD Primary Cardiologist: Dr Anner Primary Electrophysiologist: none Referring Physician: Dr Cindy Steffan Juan Butler is a 78 y.o. male with a history of HTN, anxiety, atrial fibrillation who presents for follow up in the [facility].  The patient was initially diagnosed with atrial fibrillation 06/03/22 after presenting to the ED with symptoms of palpitations and lightheadedness. He is a agricultural consultant at [employer] and his symptoms began while pushing a wheelchair. ECG showed rapid afib and he was started on diltiazem  drip which eventually converted him to SR after several hours. Patient was started on Eliquis  for stroke prevention and metoprolol  for rate control. Echo showed EF 70-75%, no valvular issues. In hindsight, patient may have had brief episodes of afib prior to this that he attributed to anxiety.   Patient was seen 01/05/24 in afib. He was set up for DCCV but converted to SR with extra dose of diltiazem .   Patient returns for follow up for atrial fibrillation. He reports that he has done well since his last visit. He denies any interim symptoms of afib. No bleeding issues on anticoagulation. He has noticed brief headaches in the past few weeks.   Today, he  denies symptoms of palpitations, chest pain, shortness of breath, orthopnea, PND, lower extremity edema, dizziness, presyncope, syncope, snoring, daytime somnolence, bleeding, or neurologic sequela. The patient is tolerating medications without difficulties and is otherwise without complaint today.    Atrial Fibrillation Risk Factors:  he does not have symptoms or diagnosis of sleep apnea. he does not have a history of rheumatic fever.   Atrial Fibrillation Management history:  Previous antiarrhythmic drugs: none Previous cardioversions: none Previous ablations: none Anticoagulation history: Eliquis    Past Medical History:  Diagnosis Date   A-fib (HCC)    Anxiety     Arthritis    Dysrhythmia    Enlarged prostate    Hypertension     Current Outpatient Medications  Medication Sig Dispense Refill   apixaban  (ELIQUIS ) 5 MG TABS tablet Take 1 tablet (5 mg total) by mouth 2 (two) times daily. 180 tablet 3   diazepam  (VALIUM ) 5 MG tablet Take 5 mg by mouth daily as needed for anxiety.     diltiazem  (CARDIZEM  CD) 120 MG 24 hr capsule Take 1 capsule (120 mg total) by mouth daily. 90 capsule 1   diltiazem  (CARDIZEM ) 30 MG tablet Take 1 Tablet Every 4 Hours As Needed For HR >100 AND TOP BP>100 45 tablet 1   fluticasone (CUTIVATE) 0.05 % cream Apply 1 Application topically daily as needed (psoriasis).     Multiple Vitamin (MULTIVITAMIN) tablet Take 1 tablet by mouth daily.     Multiple Vitamins-Minerals (PRESERVISION AREDS 2) CAPS Take 1 capsule by mouth 2 (two) times daily.     Polyethyl Glycol-Propyl Glycol (SYSTANE) 0.4-0.3 % SOLN Place 1 drop into both eyes daily as needed (Dry eyes).     tamsulosin  (FLOMAX ) 0.4 MG CAPS capsule Take 0.4 mg by mouth 2 (two) times daily.     fluocinonide (LIDEX) 0.05 % external solution Apply 1 Application topically as needed.     No current facility-administered medications for this encounter.    ROS- All systems are reviewed and negative except as per the HPI above.  Physical Exam: Vitals:   08/07/24 1455  BP: (!) 154/66  Pulse: (!) 53  Weight: 75 kg  Height: 5' 9 (1.753 m)     GEN: Well nourished, well developed in no  acute distress CARDIAC: Regular rate and rhythm, no murmurs, rubs, gallops RESPIRATORY:  Clear to auscultation without rales, wheezing or rhonchi  ABDOMEN: Soft, non-tender, non-distended EXTREMITIES:  No edema; No deformity    Wt Readings from Last 3 Encounters:  08/07/24 75 kg  05/02/24 81.2 kg  02/01/24 73.6 kg    EKG today demonstrates  SB Vent. rate 53 BPM PR interval 178 ms QRS duration 98 ms QT/QTcB 414/388 ms   Echo 06/04/22 demonstrated   1. Left ventricular ejection  fraction, by estimation, is 70 to 75%. The  left ventricle has hyperdynamic function. The left ventricle has no  regional wall motion abnormalities. Left ventricular diastolic parameters  are consistent with Grade I diastolic dysfunction (impaired relaxation).   2. Right ventricular systolic function is normal. The right ventricular  size is normal.   3. The mitral valve is normal in structure. No evidence of mitral valve  regurgitation. No evidence of mitral stenosis.   4. The aortic valve is tricuspid. Aortic valve regurgitation is not  visualized. No aortic stenosis is present.   5. The inferior vena cava is normal in size with greater than 50%  respiratory variability, suggesting right atrial pressure of 3 mmHg.   Comparison(s): No prior Echocardiogram.  Epic records are reviewed at length today   CHA2DS2-VASc Score = 3  The patient's score is based upon: CHF History: 0 HTN History: 1 Diabetes History: 0 Stroke History: 0 Vascular Disease History: 0 Age Score: 2 Gender Score: 0       ASSESSMENT AND PLAN: Paroxysmal Atrial Fibrillation (ICD10:  I48.0) The patient's CHA2DS2-VASc score is 3, indicating a 3.2% annual risk of stroke.   Patient appears to be maintaining SR Continue diltiazem  120 mg daily with 30 mg PRN q 4 hours for heart racing.  Continue Eliquis  5 mg BID Kardia mobile for home monitoring.   Secondary Hypercoagulable State (ICD10:  D68.69) The patient is at significant risk for stroke/thromboembolism based upon his CHA2DS2-VASc Score of 3.  Continue Apixaban  (Eliquis ). No bleeding issues.   HTN Mildly elevated in office, within normal limits on home BP machine.  No changes today.    Follow up in the AF clinic in one year.    Daril Kicks PA-C [facility] [address] [place], [place] [address] [phone] 08/07/2024 3:19 PM

## 2024-12-01 ENCOUNTER — Ambulatory Visit: Admitting: Podiatry

## 2024-12-05 ENCOUNTER — Ambulatory Visit (HOSPITAL_COMMUNITY): Admitting: Physician Assistant

## 2025-02-20 ENCOUNTER — Ambulatory Visit (HOSPITAL_COMMUNITY): Admit: 2025-02-20 | Admitting: Orthopedic Surgery

## 2025-02-20 SURGERY — ARTHROPLASTY, KNEE, TOTAL
Anesthesia: Spinal | Site: Knee | Laterality: Right
